# Patient Record
Sex: Female | Born: 1980 | Race: Black or African American | Hispanic: No | Marital: Single | Smoking: Current every day smoker
Health system: Southern US, Community
[De-identification: ages and names within clinical notes are randomized; demographics above are authoritative.]

## PROBLEM LIST (undated history)

## (undated) HISTORY — PX: TUBAL LIGATION: SHX77

## (undated) HISTORY — PX: CERVICAL CERCLAGE: SHX1329

---

## 1998-05-07 ENCOUNTER — Emergency Department (HOSPITAL_COMMUNITY): Admission: EM | Admit: 1998-05-07 | Discharge: 1998-05-07 | Payer: Self-pay | Admitting: Emergency Medicine

## 1999-11-01 ENCOUNTER — Emergency Department (HOSPITAL_COMMUNITY): Admission: EM | Admit: 1999-11-01 | Discharge: 1999-11-01 | Payer: Self-pay | Admitting: Emergency Medicine

## 2000-04-28 ENCOUNTER — Other Ambulatory Visit: Admission: RE | Admit: 2000-04-28 | Discharge: 2000-04-28 | Payer: Self-pay | Admitting: Obstetrics

## 2001-10-26 ENCOUNTER — Encounter: Admission: RE | Admit: 2001-10-26 | Discharge: 2001-10-26 | Payer: Self-pay | Admitting: *Deleted

## 2001-10-26 ENCOUNTER — Other Ambulatory Visit: Admission: RE | Admit: 2001-10-26 | Discharge: 2001-10-26 | Payer: Self-pay | Admitting: *Deleted

## 2001-10-26 ENCOUNTER — Encounter (INDEPENDENT_AMBULATORY_CARE_PROVIDER_SITE_OTHER): Payer: Self-pay | Admitting: *Deleted

## 2001-11-16 ENCOUNTER — Encounter: Admission: RE | Admit: 2001-11-16 | Discharge: 2001-11-16 | Payer: Self-pay | Admitting: *Deleted

## 2002-02-14 ENCOUNTER — Encounter: Admission: RE | Admit: 2002-02-14 | Discharge: 2002-02-14 | Payer: Self-pay | Admitting: Obstetrics and Gynecology

## 2002-08-17 ENCOUNTER — Inpatient Hospital Stay (HOSPITAL_COMMUNITY): Admission: AD | Admit: 2002-08-17 | Discharge: 2002-08-17 | Payer: Self-pay | Admitting: *Deleted

## 2002-08-23 ENCOUNTER — Encounter: Payer: Self-pay | Admitting: *Deleted

## 2002-08-23 ENCOUNTER — Ambulatory Visit (HOSPITAL_COMMUNITY): Admission: RE | Admit: 2002-08-23 | Discharge: 2002-08-23 | Payer: Self-pay | Admitting: *Deleted

## 2002-11-30 ENCOUNTER — Inpatient Hospital Stay (HOSPITAL_COMMUNITY): Admission: AD | Admit: 2002-11-30 | Discharge: 2002-11-30 | Payer: Self-pay | Admitting: Obstetrics & Gynecology

## 2002-12-18 ENCOUNTER — Inpatient Hospital Stay (HOSPITAL_COMMUNITY): Admission: AD | Admit: 2002-12-18 | Discharge: 2002-12-21 | Payer: Self-pay | Admitting: Obstetrics and Gynecology

## 2003-04-10 ENCOUNTER — Emergency Department (HOSPITAL_COMMUNITY): Admission: EM | Admit: 2003-04-10 | Discharge: 2003-04-11 | Payer: Self-pay

## 2003-08-06 ENCOUNTER — Emergency Department (HOSPITAL_COMMUNITY): Admission: EM | Admit: 2003-08-06 | Discharge: 2003-08-06 | Payer: Self-pay | Admitting: Emergency Medicine

## 2004-07-22 ENCOUNTER — Emergency Department (HOSPITAL_COMMUNITY): Admission: EM | Admit: 2004-07-22 | Discharge: 2004-07-22 | Payer: Self-pay | Admitting: Emergency Medicine

## 2004-09-24 ENCOUNTER — Emergency Department (HOSPITAL_COMMUNITY): Admission: EM | Admit: 2004-09-24 | Discharge: 2004-09-24 | Payer: Self-pay | Admitting: *Deleted

## 2004-12-24 ENCOUNTER — Emergency Department (HOSPITAL_COMMUNITY): Admission: EM | Admit: 2004-12-24 | Discharge: 2004-12-24 | Payer: Self-pay | Admitting: Emergency Medicine

## 2005-01-21 ENCOUNTER — Ambulatory Visit: Payer: Self-pay | Admitting: Certified Nurse Midwife

## 2005-01-21 ENCOUNTER — Inpatient Hospital Stay (HOSPITAL_COMMUNITY): Admission: AD | Admit: 2005-01-21 | Discharge: 2005-01-25 | Payer: Self-pay | Admitting: *Deleted

## 2005-01-21 ENCOUNTER — Encounter: Payer: Self-pay | Admitting: *Deleted

## 2005-01-30 ENCOUNTER — Encounter (INDEPENDENT_AMBULATORY_CARE_PROVIDER_SITE_OTHER): Payer: Self-pay | Admitting: *Deleted

## 2005-01-30 ENCOUNTER — Ambulatory Visit: Payer: Self-pay | Admitting: Obstetrics & Gynecology

## 2005-01-30 ENCOUNTER — Inpatient Hospital Stay (HOSPITAL_COMMUNITY): Admission: AD | Admit: 2005-01-30 | Discharge: 2005-01-31 | Payer: Self-pay | Admitting: Obstetrics and Gynecology

## 2005-02-17 ENCOUNTER — Inpatient Hospital Stay (HOSPITAL_COMMUNITY): Admission: AD | Admit: 2005-02-17 | Discharge: 2005-02-17 | Payer: Self-pay | Admitting: Obstetrics and Gynecology

## 2006-01-26 ENCOUNTER — Emergency Department (HOSPITAL_COMMUNITY): Admission: EM | Admit: 2006-01-26 | Discharge: 2006-01-26 | Payer: Self-pay | Admitting: Emergency Medicine

## 2006-04-07 ENCOUNTER — Emergency Department (HOSPITAL_COMMUNITY): Admission: EM | Admit: 2006-04-07 | Discharge: 2006-04-07 | Payer: Self-pay | Admitting: Emergency Medicine

## 2006-06-17 ENCOUNTER — Emergency Department (HOSPITAL_COMMUNITY): Admission: EM | Admit: 2006-06-17 | Discharge: 2006-06-18 | Payer: Self-pay | Admitting: Emergency Medicine

## 2006-09-07 ENCOUNTER — Emergency Department (HOSPITAL_COMMUNITY): Admission: EM | Admit: 2006-09-07 | Discharge: 2006-09-07 | Payer: Self-pay | Admitting: Emergency Medicine

## 2006-11-15 ENCOUNTER — Emergency Department (HOSPITAL_COMMUNITY): Admission: EM | Admit: 2006-11-15 | Discharge: 2006-11-15 | Payer: Self-pay | Admitting: Emergency Medicine

## 2007-08-08 ENCOUNTER — Emergency Department (HOSPITAL_COMMUNITY): Admission: EM | Admit: 2007-08-08 | Discharge: 2007-08-08 | Payer: Self-pay | Admitting: Emergency Medicine

## 2007-08-27 ENCOUNTER — Other Ambulatory Visit: Admission: RE | Admit: 2007-08-27 | Discharge: 2007-08-27 | Payer: Self-pay | Admitting: Obstetrics and Gynecology

## 2007-09-13 ENCOUNTER — Inpatient Hospital Stay (HOSPITAL_COMMUNITY): Admission: AD | Admit: 2007-09-13 | Discharge: 2007-09-13 | Payer: Self-pay | Admitting: Obstetrics and Gynecology

## 2007-10-09 ENCOUNTER — Ambulatory Visit (HOSPITAL_COMMUNITY): Admission: RE | Admit: 2007-10-09 | Discharge: 2007-10-10 | Payer: Self-pay | Admitting: General Surgery

## 2008-01-23 ENCOUNTER — Inpatient Hospital Stay (HOSPITAL_COMMUNITY): Admission: AD | Admit: 2008-01-23 | Discharge: 2008-01-23 | Payer: Self-pay | Admitting: Obstetrics and Gynecology

## 2008-01-24 ENCOUNTER — Inpatient Hospital Stay (HOSPITAL_COMMUNITY): Admission: AD | Admit: 2008-01-24 | Discharge: 2008-01-24 | Payer: Self-pay | Admitting: Obstetrics and Gynecology

## 2008-02-04 ENCOUNTER — Inpatient Hospital Stay (HOSPITAL_COMMUNITY): Admission: AD | Admit: 2008-02-04 | Discharge: 2008-02-04 | Payer: Self-pay | Admitting: Obstetrics and Gynecology

## 2008-02-06 ENCOUNTER — Encounter (INDEPENDENT_AMBULATORY_CARE_PROVIDER_SITE_OTHER): Payer: Self-pay | Admitting: Obstetrics and Gynecology

## 2008-02-06 ENCOUNTER — Inpatient Hospital Stay (HOSPITAL_COMMUNITY): Admission: AD | Admit: 2008-02-06 | Discharge: 2008-02-08 | Payer: Self-pay | Admitting: Obstetrics and Gynecology

## 2008-04-20 ENCOUNTER — Emergency Department (HOSPITAL_COMMUNITY): Admission: EM | Admit: 2008-04-20 | Discharge: 2008-04-21 | Payer: Self-pay | Admitting: Emergency Medicine

## 2008-05-20 ENCOUNTER — Ambulatory Visit (HOSPITAL_COMMUNITY): Admission: RE | Admit: 2008-05-20 | Discharge: 2008-05-20 | Payer: Self-pay | Admitting: Obstetrics and Gynecology

## 2009-06-09 ENCOUNTER — Emergency Department (HOSPITAL_COMMUNITY): Admission: EM | Admit: 2009-06-09 | Discharge: 2009-06-09 | Payer: Self-pay | Admitting: Emergency Medicine

## 2009-07-16 ENCOUNTER — Emergency Department (HOSPITAL_COMMUNITY): Admission: EM | Admit: 2009-07-16 | Discharge: 2009-07-16 | Payer: Self-pay | Admitting: Emergency Medicine

## 2010-07-27 LAB — URINALYSIS, ROUTINE W REFLEX MICROSCOPIC
Bilirubin Urine: NEGATIVE
Glucose, UA: NEGATIVE mg/dL
Ketones, ur: NEGATIVE mg/dL
Specific Gravity, Urine: 1.01 (ref 1.005–1.030)

## 2010-07-27 LAB — CBC
HCT: 41.7 % (ref 36.0–46.0)
Hemoglobin: 13.8 g/dL (ref 12.0–15.0)
MCHC: 33 g/dL (ref 30.0–36.0)
RDW: 14.8 % (ref 11.5–15.5)

## 2010-07-27 LAB — PREGNANCY, URINE: Preg Test, Ur: NEGATIVE

## 2010-08-24 NOTE — H&P (Signed)
NAME:  Nicole [name], Nicole [name] NO.:  [id number]   MEDICAL RECORD NO.:  [id number]           PATIENT TYPE:   LOCATION:                                 FACILITY:   PHYSICIAN:  Gerald Leitz, MD          DATE OF BIRTH:  01-Aug-1980   DATE OF ADMISSION:  DATE OF DISCHARGE:                              HISTORY & PHYSICAL   The patient scheduled for surgery on October 02, 2007.   HISTORY OF PRESENT ILLNESS:  This is a 30 year old G5, P1-0-3-1 at 15  weeks and 3 days intrauterine pregnancy based on a 6-week ultrasound  with estimated date of delivery March 22, 2008.  Pregnancy is  complicated by history of LEEP procedure as well as history of cervical  incompetence with previous pregnancy on ultrasound performed September 25, 2007.  The cervix measured 4.62 cm.  The patient denies any contraction.  She has had some vaginal bleeding, had an ultrasound that did not show  any abnormalities, but prep in the office did show a candidiasis and she  has been treated with Terazol cream 0.8%.   Past medical history is negative.   PAST SURGICAL HISTORY:  Cerclage in 2006 and LEEP procedure in 1997.   MEDICATIONS:  Prenatal vitamins and Terazol cream.   ALLERGIES:  No known drug allergies.   PAST GYN HISTORY:  Gonorrhea at age 64, Trichomonas at 2006, Pap smear  performed Aug 27, 2007 was normal, and history of LEEP procedure in 1997  and 1998.   SOCIAL HISTORY:  Single.  The patient smokes tobacco.  Denies alcohol or  illicit drug use.   REVIEW OF SYSTEMS:  Negative except as stated in history of current  illness.   PHYSICAL EXAMINATION:  VITAL SIGNS:  Blood pressure 114/68 and weight  213 pounds.  CARDIOVASCULAR:  Regular rate and rhythm.  LUNGS:  Clear to auscultation bilaterally.  ABDOMEN:  Soft, nontender, and nondistended.  No masses.  PELVIC:  Old blood in the vaginal vault.  Wet prep was obtained and  shows candidiasis.  The cervix is closed.  Fundal height is  approximately  14 cm.  No evidence of masses or tenderness.   IMPRESSION AND PLAN:  A 15-week and 3-day intrauterine pregnancy with  history of cervical incompetence.  Desires management with cerclage.  Risks, benefits, and alternatives of the clots were discussed with the  patient including but not limited to infection, bleeding, possible  preterm labor and delivery of a nonviable infant.  The patient voiced  understanding and was asked to proceed with cervical cerclage.      Gerald Leitz, MD  Electronically Signed     TC/MEDQ  D:  09/28/2007  T:  09/29/2007  Job:  [id number]

## 2010-08-24 NOTE — H&P (Signed)
NAME:  Nicole [name], Nicole [name]               ACCOUNT NO.:  [id number]   MEDICAL RECORD NO.:  [id number]          PATIENT TYPE:  AMB   LOCATION:  DAY                           [facility]  [facility]   PHYSICIAN:  Gerald Leitz, MD          DATE OF BIRTH:  09/05/1980   DATE OF ADMISSION:  05/20/2008  DATE OF DISCHARGE:                              HISTORY & PHYSICAL   HISTORY OF PRESENT ILLNESS:  This is a 30 year old G5, P 1-1-3-2, who  desires permanent sterilization.   PAST OBSTETRICAL HISTORY:  Spontaneous vaginal delivery x2, elective  abortion x2, still birth at 19.5 weeks.   PAST MEDICAL HISTORY:  Negative.   PAST SURGICAL HISTORY:  Cervical cerclage in 2006 and in 2009.   PAST GYNECOLOGIC HISTORY:  History of gonorrhea at the age of 30 and  Trichomonas in 2006.   MEDICATIONS:  None.   ALLERGIES:  No known drug allergies.   SOCIAL HISTORY:  The patient is single.  She smokes cigarettes  approximately 5 cigarettes a day.  Reports alcohol usage on weekends.  Denies any illicit drug use.   FAMILY HISTORY:  Noncontributory.   PHYSICAL EXAMINATION:  CARDIOVASCULAR:  Regular rate and rhythm.  LUNGS:  Clear to auscultation bilaterally.  ABDOMEN:  Soft, nontender, and nondistended.  No masses.  Positive bowel  sounds.  EXTREMITIES:  No clubbing, cyanosis, or edema.  PELVIC:  Normal external female genitalia.  No vulvar, vaginal, or  cervical lesions noted.  Bimanual exam reveals normal size uterus, no  adnexal masses or tenderness.   IMPRESSION AND PLAN:  Desires permanent sterilization via laparoscopic  tubal ligation.  Risks, benefits, and alternatives of the tubal ligation  were discussed with the patient including, but not limited to infection,  bleeding, damage to bowel or bladder, and surrounding organs with the  need for further surgery, 1% risk of failure with 50% risk of ectopic  pregnancy which may be life-threatening if failure occurs, also  discussed with the patient the risks of  transfusion, human  immunodeficiency virus, hepatitis B, C, and syphilis.  She voiced  understanding and desires to proceed with laparoscopic bilateral tubal  ligation.     Gerald Leitz, MD  Electronically Signed    TC/MEDQ  D:  05/16/2008  T:  05/17/2008  Job:  [id number]

## 2010-08-24 NOTE — Op Note (Signed)
NAMEEUVA, RUNDELL               ACCOUNT NO.:  [id number]   MEDICAL RECORD NO.:  [id number]          PATIENT TYPE:  OBV   LOCATION:  9318                          [facility]ACILITY:  [facility]   PHYSICIAN:  Gerald Leitz, MD          DATE OF BIRTH:  1980-11-06   DATE OF PROCEDURE:  DATE OF DISCHARGE:                               OPERATIVE REPORT   PREOPERATIVE DIAGNOSES:  1. 16-week 3-day intrauterine pregnancy.  2. History of cervical incompetence.   POSTOPERATIVE DIAGNOSES:  1. 16-week 3-day intrauterine pregnancy.  2. History of cervical incompetence.   PROCEDURE:  McDonald cervical cerclage.   SURGEON:  Gerald Leitz, MD   ASSISTANT:  None.   ANESTHESIA:  Spinal.   FINDINGS:  Cervix was closed and soft.   SPECIMEN:  None.   ESTIMATED BLOOD LOSS:  Minimal.   COMPLICATIONS:  None.   INDICATION:  A 30 year old G 5, P 1-0-3-1 with history of cervical  incompetence with a previous pregnancy who desired therapy with cervical  cerclage.  The patient had a LEEP procedure in 1997.   PROCEDURE:  Informed consent was obtained.  The patient was taken to the  operating room where spinal anesthesia was placed.  She was placed in  the dorsal lithotomy position, prepped and draped in the usual sterile  fashion.  Weighted speculum placed in the vaginal vault.  The cervix was  visualized and grasped posteriorly with a ring forceps.  Due to the  short nature of the posterior cervix, a 1 Prolene was placed  circumferentially around the  cervix using McDonald technique.  It was tied at the 12 o'clock  position.  Excellent hemostasis was noted.  All instruments were removed  from the vagina.  Indocin was placed into the rectum, 50 mg.  The  patient was taken to the recovery room awake and in stable condition.      Gerald Leitz, MD  Electronically Signed     TC/MEDQ  D:  10/09/2007  T:  10/10/2007  Job:  [id number]

## 2010-08-24 NOTE — Op Note (Signed)
NAME:  BASHA, KRYGIER NO.:  [id number]   MEDICAL RECORD NO.:  [id number]          PATIENT TYPE:  AMB   LOCATION:  SDC                           [facility]ILITY:  [facility]   PHYSICIAN:  Gerald Leitz, MD          DATE OF BIRTH:  [birth year]   DATE OF PROCEDURE:  05/20/2008  DATE OF DISCHARGE:                               OPERATIVE REPORT   PREOPERATIVE DIAGNOSIS:  Desires permanent sterilization.   POSTOPERATIVE DIAGNOSIS:  Desires permanent sterilization.   PROCEDURE:  Laparoscopic bilateral tubal ligation with Filshie clips.   SURGEON:  Dr. Gerald Leitz, MD   ASSISTANT:  None.   ANESTHESIA:  General.   FINDINGS:  Normal fallopian tubes and ovaries.   SPECIMEN:  None.   ESTIMATED BLOOD LOSS:  Minimal.   FLUIDS:  1500 mL of LR.   URINE OUTPUT:  Approximately 200 mL of clear urine at the beginning of  procedure.   PROCEDURE:  The patient was taken to the operating room where she was  placed under general anesthesia.  She was then placed in the dorsal  lithotomy position and prepped and draped in the usual sterile fashion.  A uterine manipulator was placed into the cervix.  Attention was turned  to the abdomen where a 10-mm skin incision was made with a scalpel and a  10-mm trocar was placed under direct visualization.  Pneumoperitoneum  was achieved with CO2 gas.  Opening pressure was 7.  Operative scope was  used to place Filshie clip along the right fallopian tube.  Proper  placement was assured.  The Filshie clip applier was removed, reloaded,  and Filshie clips were then placed along the left fallopian tube.  Proper placement was assured.  Fimbria were identified bilaterally.  Prior to placement and after placement, pneumoperitoneum was released  and the 10 L trocar was removed under direct visualization.  The fascia  was reapproximated with 0 Vicryl.  Skin was closed with 4-0 Vicryl.  Sponge, lap, needle counts were correct x2.  Quater percent Marcaine was  injected along the incision site for postoperative anesthesia.  The uterine manipulator was removed.  The patient was noted to have some  bleeding from the uterine manipulator site.  Silver nitrate was applied.  Excellent hemostasis noted.  The patient was awakened from anesthesia  and taken to recovery room in excellent condition.      Gerald Leitz, MD  Electronically Signed     TC/MEDQ  D:  05/20/2008  T:  05/21/2008  Job:  [id number]

## 2010-08-27 NOTE — Discharge Summary (Signed)
NAME:  Nicole [name], Nicole [name]               ACCOUNT NO.:  [id number]   MEDICAL RECORD NO.:  [id number]           PATIENT TYPE:   LOCATION:                                 FACILITY:   PHYSICIAN:  Phil D. Okey Dupre, M.D.          DATE OF BIRTH:   DATE OF ADMISSION:  01/30/2005  DATE OF DISCHARGE:  01/31/2005                                 DISCHARGE SUMMARY   ADMITTING DIAGNOSES:  1.  Preterm premature rupture of membranes.  2.  Inevitable abortion.  3.  Failed cerclage.   DISCHARGE DIAGNOSES:  Status post spontaneous vaginal delivery.   ADMITTING HISTORY AND PHYSICAL:  Patient is a 30 year old G5, P0-1-3-1 who  presented at 19-5/7 weeks by her LMP stating that she had a fluid gush.  Past medical history is significant for having a cerclage placed five days  previously.  She also had history of Trichomonas which was treated in the  past week.   MEDICATIONS:  Prenatal vitamins.   ALLERGIES:  None.   OBSTETRICAL HISTORY:  Spontaneous vaginal delivery at 35 weeks, two TABs,  and one SAB.   GYN HISTORY:  History of LEEP, colpo x2.   MEDICAL HISTORY:  Asthma.   SURGICAL HISTORY:  Cerclage on October 16 of 2006.   FAMILY HISTORY:  No smoking.   PHYSICAL EXAMINATION:  VITAL SIGNS:  She was afebrile.  Vital signs stable.  GENERAL:  No acute distress.  GENITOURINARY:  Her os was visually opened by the speculum examination with  pooling.  Nitrazine and fern positive.   HOSPITAL COURSE:  Patient was admitted.  Her cerclage was removed.  Dr.  Shawnie Pons had discussion with the patient and patient wished to have Cytotec  placed.  She also was started on Unasyn.  Patient remained stable.  She did  deliver the fetus on January 30, 2005.  There was no signs of life at birth.  Patient received a dose of Cytotec per rectum and the placenta was delivered  at 1415.  The baby was delivered at 10:40.  Patient did have temperature  elevation up to 104.6 but she defervesced after the delivery of the  placenta.  Her Unasyn also was stopped after delivery of the placenta.  Patient did have a pastoral care see her as well as social work and  discussed postpartum depression and follow-up care concerning that and  patient was emotionally doing well throughout her hospital course.   DISCHARGE DISCUSSION:  The patient was discharged to home.  She is to follow  up [facility] with [facility].   DISCHARGE MEDICATIONS:  Ibuprofen 600 mg one p.o. q.4h.  She is to continue  her prenatal vitamins.  She was given a shot of Depo Provera before she  left.   Patient and I had a conversation about postpartum depression and symptoms of  this and increased risk of this with intrauterine fetal death and she  expressed understanding and stated that she would report to the clinic or  MAU if she were having any problems.  She also stated that she  had a contact  number for the social worker in case she needed to speak with someone  concerning her loss.     ______________________________  August Saucer Merlene Morse, MD    ______________________________  Javier Glazier Okey Dupre, M.D.    ABC/MEDQ  D:  01/31/2005  T:  01/31/2005  Job:  [id number]

## 2010-08-27 NOTE — Discharge Summary (Signed)
NAME:  Nicole [name], Nicole [name] NO.:  [id number]   MEDICAL RECORD NO.:  [id number]          PATIENT TYPE:  WOC   LOCATION:  WOC                          FACILITY:  [facility]   PHYSICIAN:  Lesly Dukes, M.D. DATE OF BIRTH:  12-24-1980   DATE OF ADMISSION:  DATE OF DISCHARGE:                                 DISCHARGE SUMMARY   CONSULTATIONS:  None.   DIAGNOSES:  1.  Cervical incompetence.  2.  Status post cerclage with tag at 1 o'clock position.  3.  Trichomonas.  4.  Yeast.  5.  Urinary tract infection.  6.  Threatened abortion.   PROCEDURES:  1.  Ultrasound January 21, 2005, showing an alive, normal, breach baby at 28      and 2 with a 1.1 and 1.3 cm cervix.  2.  Technical procedure with cerclage on January 24, 2005, by Dr. Penne Lash.   MEDICATIONS:  Prenatal vitamins.   HOSPITAL COURSE:  A 30 year old G5, P0-1-3-1, came in 19-3/4 weeks with  history of 3 spontaneous abortions and 1 preterm at 35 weeks.  She came in  with abdominal pain and ultrasound from [facility]  indicating short cervix.  The patient was monitored with no contractions and  reassuring fetal heart tones.  The patient was placed in Trendelenburg.  Lab  studies indicated the patient had trichomonas, yeast, and possible urinary  tract infection.  She was treated with Flagyl, Diflucan, ampicillin  expectantly.  On January 24, 2005, the patient had cerclage with tag placed  at 1 o'clock position. The patient was on indomethacin at admission and did  not have any contractions throughout her hospital stay.  She was discharged  on postop day #1 after cervix recheck showed that cervix was stable,  fingertip.   IMPORTANT LABORATORY STUDIES:  HIV nonreactive.  GC and chlamydia negative.  Moderate yeast.  Positive Trichomonas on urinalysis.  Possible evidence of  UTI on urinalysis.  Hemoglobin 11.7, platelets 179, white count 10.8.   No contractions.  Fetal heart tracing  reassuring.   DISCHARGE INSTRUCTIONS:  The patient will be discharged with bed rest, no  sex.  The patient will follow up in OB high risk clinic on February 02, 2005,  at 8 o'clock in the morning.      Angeline Slim, M.D.    ______________________________  Lesly Dukes, M.D.    AL/MEDQ  D:  01/25/2005  T:  01/25/2005  Job:  [id number]   cc:   [facility], [facility]

## 2010-08-27 NOTE — Op Note (Signed)
NAMESHARRELL, Nicole [name]               ACCOUNT NO.:  [id number]   MEDICAL RECORD NO.:  [id number]          PATIENT TYPE:  INP   LOCATION:  9304                          [facility]ACILITY:  [facility]   PHYSICIAN:  Lesly Dukes, M.D. DATE OF BIRTH:  10-04-1980   DATE OF PROCEDURE:  01/24/2005  DATE OF DISCHARGE:                                 OPERATIVE REPORT   PREOPERATIVE DIAGNOSIS:  A 30 year old Para 0-1-3-1 at 19 weeks 2 days with  pancervical exam __________.   POSTOPERATIVE DIAGNOSIS:  A 30 year old Para 0-1-3-1 at 19 weeks 2 days with  pancervical exam __________ .   PROCEDURE:  Rescue McDonald cerclage.   SURGEON:  Lesly Dukes, M.D.   ANESTHESIA:  Spinal.   ESTIMATED BLOOD LOSS:  Minimal.   FINDINGS:  Before procedure cervix with 1-2 cm, soft, short with positive  fetal parts felt.  After the cerclage she is barely fingertip, short, with  no fetal parts felt.   DESCRIPTION OF PROCEDURE:  After informed consent was obtained the patient  was taken to the operating room where spinal anesthesia was found to be  adequate.  The patient was in Trendelenburg.  The patient was prepped and  draped in the normal sterile fashion with Hibiclens.  A Foley was in the  bladder. Retractors were placed in the vagina and the cervix was brought  into view with sponge stick.  A #1 Prolene was used to tie the sutures in a  pursestring fashion as a McDonald cerclage. A knot was placed at  approximately 1 o'clock with a tag.  This means that there is a set of knots  close the cervix, an empty loop and then a set of knots after that.  The tab  will help aid in visualization upon removal at 36 weeks.  Findings are as  above.  Instruments are removed that were placed in the vagina. There was  good hemostasis at the end of the procedure. The patient went to the  recovery room in stable condition.  Sponge, lap, instrument and needle  counts were correct x2.           ______________________________  Lesly Dukes, M.D.     KHL/MEDQ  D:  01/24/2005  T:  01/24/2005  Job:  [id number]

## 2010-10-18 ENCOUNTER — Inpatient Hospital Stay (HOSPITAL_COMMUNITY)
Admission: RE | Admit: 2010-10-18 | Discharge: 2010-10-18 | Disposition: A | Discharge status: Home / Self Care | Payer: Self-pay | Source: Ambulatory Visit | Attending: Emergency Medicine | Admitting: Emergency Medicine

## 2011-01-06 LAB — URINALYSIS, ROUTINE W REFLEX MICROSCOPIC
Bilirubin Urine: NEGATIVE
Glucose, UA: NEGATIVE
Glucose, UA: NEGATIVE
Hgb urine dipstick: NEGATIVE
Ketones, ur: 15 — AB
Leukocytes, UA: NEGATIVE
Nitrite: NEGATIVE
Protein, ur: NEGATIVE
Protein, ur: NEGATIVE
Urobilinogen, UA: 0.2
Urobilinogen, UA: 0.2

## 2011-01-06 LAB — CBC
HCT: 39.1
Hemoglobin: 12.1
Hemoglobin: 12.7
MCHC: 33.5
MCHC: 32.4
MCHC: 34.3
MCV: 86.8
MCV: 86.8
Platelets: 208
RBC: 3.99
RBC: 4.51
RDW: 15.1
RDW: 14
RDW: 14.7

## 2011-01-06 LAB — DIFFERENTIAL
Basophils Absolute: 0.1
Basophils Relative: 1
Eosinophils Absolute: 0.2
Monocytes Absolute: 0.3
Neutro Abs: 5.7

## 2011-01-06 LAB — URINE MICROSCOPIC-ADD ON

## 2011-01-10 LAB — CBC
HCT: 28.3 — ABNORMAL LOW
HCT: 33.4 — ABNORMAL LOW
Hemoglobin: 9.6 — ABNORMAL LOW
MCV: 87.3
Platelets: 174
RBC: 3.21 — ABNORMAL LOW
RBC: 3.83 — ABNORMAL LOW
RDW: 14.1
WBC: 16.3 — ABNORMAL HIGH

## 2011-01-10 LAB — RPR: RPR Ser Ql: NONREACTIVE

## 2011-01-10 LAB — WET PREP, GENITAL: Trich, Wet Prep: NONE SEEN

## 2011-04-30 ENCOUNTER — Emergency Department (HOSPITAL_COMMUNITY)
Admission: EM | Admit: 2011-04-30 | Discharge: 2011-04-30 | Disposition: A | Discharge status: Home / Self Care | Payer: Self-pay | Attending: Emergency Medicine | Admitting: Emergency Medicine

## 2011-04-30 ENCOUNTER — Encounter (HOSPITAL_COMMUNITY): Payer: Self-pay | Admitting: Emergency Medicine

## 2011-04-30 DIAGNOSIS — K089 Disorder of teeth and supporting structures, unspecified: Secondary | ICD-10-CM | POA: Insufficient documentation

## 2011-04-30 DIAGNOSIS — X58XXXA Exposure to other specified factors, initial encounter: Secondary | ICD-10-CM | POA: Insufficient documentation

## 2011-04-30 DIAGNOSIS — S025XXA Fracture of tooth (traumatic), initial encounter for closed fracture: Secondary | ICD-10-CM

## 2011-04-30 DIAGNOSIS — K0889 Other specified disorders of teeth and supporting structures: Secondary | ICD-10-CM

## 2011-04-30 MED ORDER — PENICILLIN V POTASSIUM 500 MG PO TABS: 500.0000 mg | ORAL_TABLET | Freq: Three times a day (TID) | ORAL | Status: AC

## 2011-04-30 MED ORDER — HYDROCODONE-ACETAMINOPHEN 5-325 MG PO TABS
1.0000 | ORAL_TABLET | Freq: Once | ORAL | Status: AC
  Administered 2011-04-30: 1 via ORAL
  Filled 2011-04-30: qty 1

## 2011-04-30 MED ORDER — HYDROCODONE-ACETAMINOPHEN 5-325 MG PO TABS: 1.0000 | ORAL_TABLET | Freq: Four times a day (QID) | ORAL | Status: AC | PRN

## 2011-04-30 NOTE — ED Notes (Signed)
C/o right lower molar toothache for the past 3 weeks--describes as a throbbing constant ache---rates the pain 10 on 1-10 scale.

## 2011-04-30 NOTE — ED Provider Notes (Signed)
History     CSN: [id number]  Arrival date & time 04/30/11  0118   First MD Initiated Contact with Patient 04/30/11 0131      Chief Complaint  Patient presents with  . Dental Pain    (Consider location/radiation/quality/duration/timing/severity/associated sxs/prior treatment) HPI This is a 31 year old black female with about a two-month history of a fracture of the right lower second molar. It began hurting over the past several days, with the pain becoming severe yesterday evening about 10:30. She has got inadequate relief with naproxen sodium and Orajel. She also took some of her mother's penicillin yesterday. She states the pain is worse with chewing and drinking cold beverages. She does not have a dentist.  History reviewed. No pertinent past medical history.  Past Surgical History  Procedure Date  . Cervical cerclage   . Tubal ligation     No family history on file.  History  Substance Use Topics  . Smoking status: Current Everyday Smoker  . Smokeless tobacco: Not on file  . Alcohol Use: Yes     2-3 times per week, 2 drinks per time    OB History    Grav Para Term Preterm Abortions TAB SAB Ect Mult Living                  Review of Systems  All other systems reviewed and are negative.    Allergies  Review of patient's allergies indicates no known allergies.  Home Medications   Current Outpatient Rx  Name Route Sig Dispense Refill  . BENZOCAINE 10 % MT GEL Mouth/Throat Use as directed in the mouth or throat 3 (three) times daily as needed.    Marland Kitchen NAPROXEN SODIUM 220 MG PO TABS Oral Take 220 mg by mouth 2 (two) times daily with a meal.      BP 134/77  Pulse 72  Temp(Src) 98.6 F (37 C) (Oral)  Resp 18  SpO2 100%  LMP 04/05/2011  Physical Exam General: Well-developed, well-nourished female in no acute distress; appearance consistent with age of record HENT: normocephalic, atraumatic; Ellis 3 fracture of right lower second molar, tender to  percussion Eyes: pupils equal round and reactive to light; extraocular muscles intact Neck: supple; no lymphadenopathy Heart: regular rate and rhythm Lungs: No respiratory effort and excursion Abdomen: soft; nondistended Extremities: No deformity; full range of motion Neurologic: Awake, alert and oriented; motor function intact in all extremities and symmetric; no facial droop Skin: Warm and dry Psychiatric: Normal mood and affect    ED Course  Procedures (including critical care time)     MDM          Hanley Seamen, MD 04/30/11 [phone]

## 2011-04-30 NOTE — ED Notes (Addendum)
Per Pt: severe tooth pain for two weeks in last tooth on lower right jaw, pain radiates to ear. Taking Aleve and intermittently using Orajel without relief. Took 4 old Penicilin pills also (education provided.) Pt has not contacted a dentist. Called 911 due to pain and came to ED in ambulance. Is A&O, ambulatory WNL.

## 2011-11-09 ENCOUNTER — Emergency Department (HOSPITAL_COMMUNITY)
Admission: EM | Admit: 2011-11-09 | Discharge: 2011-11-09 | Discharge status: Against Medical Advice | Payer: Self-pay | Attending: Emergency Medicine | Admitting: Emergency Medicine

## 2011-11-09 ENCOUNTER — Encounter (HOSPITAL_COMMUNITY): Payer: Self-pay | Admitting: *Deleted

## 2011-11-09 DIAGNOSIS — K6289 Other specified diseases of anus and rectum: Secondary | ICD-10-CM | POA: Insufficient documentation

## 2011-11-09 NOTE — ED Notes (Signed)
To ED for eval of possible abscess to rectal area. States she noticed the pain 4 days ago. Has used warm compresses. Pt appears in pain

## 2011-11-09 NOTE — ED Notes (Signed)
Pt showed me a picture of buttocks, pt has a hemorrhoid

## 2012-08-09 ENCOUNTER — Encounter (HOSPITAL_COMMUNITY): Payer: Self-pay | Admitting: *Deleted

## 2012-08-09 ENCOUNTER — Emergency Department (HOSPITAL_COMMUNITY)
Admission: EM | Admit: 2012-08-09 | Discharge: 2012-08-10 | Disposition: A | Discharge status: Home / Self Care | Payer: Self-pay | Attending: Emergency Medicine | Admitting: Emergency Medicine

## 2012-08-09 DIAGNOSIS — R6883 Chills (without fever): Secondary | ICD-10-CM | POA: Insufficient documentation

## 2012-08-09 DIAGNOSIS — R51 Headache: Secondary | ICD-10-CM | POA: Insufficient documentation

## 2012-08-09 DIAGNOSIS — F17200 Nicotine dependence, unspecified, uncomplicated: Secondary | ICD-10-CM | POA: Insufficient documentation

## 2012-08-09 DIAGNOSIS — K029 Dental caries, unspecified: Secondary | ICD-10-CM | POA: Insufficient documentation

## 2012-08-09 DIAGNOSIS — M542 Cervicalgia: Secondary | ICD-10-CM | POA: Insufficient documentation

## 2012-08-09 DIAGNOSIS — H9209 Otalgia, unspecified ear: Secondary | ICD-10-CM | POA: Insufficient documentation

## 2012-08-09 DIAGNOSIS — K089 Disorder of teeth and supporting structures, unspecified: Secondary | ICD-10-CM | POA: Insufficient documentation

## 2012-08-09 DIAGNOSIS — K0889 Other specified disorders of teeth and supporting structures: Secondary | ICD-10-CM

## 2012-08-09 NOTE — ED Provider Notes (Signed)
History  This chart was scribed for non-physician practitioner Dierdre Forth, PA-C working with Glynn Octave, MD, by Candelaria Stagers, ED Scribe. This patient was seen in room WTR7/WTR7 and the patient's care was started at 11:42 PM   CSN: [id number]  Arrival date & time 08/09/12  2250   First MD Initiated Contact with Patient 08/09/12 2332      Chief Complaint  Patient presents with  . Dental Pain     The history is provided by the patient. No language interpreter was used.   Nicole [name] is a 32 y.o. female who presents to the Emergency Department complaining of constant right lower dental pain that started about one year ago and became worse five days ago gradually worsening.  Pt reports she has a hole in the back lower right molar.  She is also experiencing right ear pain, right sided facial pain, right sided neck pain, and chills.  Pt has applied Orajel with no relief.  She has used Ring Relief drops with no relief of ear pain.  She has also taken ibuprofen with no relief. Eating makes the pain worse and ibuprofen makes it some better but does not completely take away the pain.    History reviewed. No pertinent past medical history.  Past Surgical History  Procedure Laterality Date  . Cervical cerclage    . Tubal ligation      No family history on file.  History  Substance Use Topics  . Smoking status: Current Every Day Smoker  . Smokeless tobacco: Not on file  . Alcohol Use: Yes     Comment: 2-3 times per week, 2 drinks per time    OB History   Grav Para Term Preterm Abortions TAB SAB Ect Mult Living                  Review of Systems  HENT: Positive for ear pain (right ear pain), neck pain (right sided neck pain) and dental problem (right lower dental pain).   All other systems reviewed and are negative.    Allergies  Review of patient's allergies indicates no known allergies.  Home Medications   Current Outpatient Rx  Name  Route  Sig  Dispense   Refill  . benzocaine (ORAJEL) 10 % mucosal gel   Mouth/Throat   Use as directed 1 application in the mouth or throat 3 (three) times daily as needed for pain.          Marland Kitchen ibuprofen (ADVIL,MOTRIN) 200 MG tablet   Oral   Take 200 mg by mouth every 6 (six) hours as needed for pain.         . naproxen sodium (ANAPROX) 220 MG tablet   Oral   Take 220 mg by mouth 2 (two) times daily as needed (for pain).          Marland Kitchen OVER THE COUNTER MEDICATION   Oral   Take 1 application by mouth 2 (two) times daily as needed (for dental pain). "Bleach applied to tooth with q-tip"         . oxyCODONE-acetaminophen (PERCOCET) 5-325 MG per tablet   Oral   Take 1 tablet by mouth every 4 (four) hours as needed for pain.   20 tablet   0   . penicillin v potassium (VEETID) 500 MG tablet   Oral   Take 1 tablet (500 mg total) by mouth 4 (four) times daily.   40 tablet   0     BP  155/107  Pulse 106  Temp(Src) 98.3 F (36.8 C) (Oral)  Resp 20  SpO2 99%  LMP 08/06/2012  Physical Exam  Nursing note and vitals reviewed. Constitutional: She appears well-developed and well-nourished.  HENT:  Head: Normocephalic.  Right Ear: Tympanic membrane, external ear and ear canal normal. Tympanic membrane is not erythematous and not bulging.  Left Ear: Tympanic membrane, external ear and ear canal normal. Tympanic membrane is not erythematous and not bulging.  Nose: Nose normal. Right sinus exhibits no maxillary sinus tenderness and no frontal sinus tenderness. Left sinus exhibits no maxillary sinus tenderness and no frontal sinus tenderness.  Mouth/Throat: Uvula is midline, oropharynx is clear and moist and mucous membranes are normal. No oral lesions. Abnormal dentition. Dental caries present. No edematous or lacerations. No oropharyngeal exudate, posterior oropharyngeal edema, posterior oropharyngeal erythema or tonsillar abscesses.  Caries in the right rear molar.  Mild erythema around base of the tooth.   No induration, no fluctuants.    Eyes: Conjunctivae are normal. Pupils are equal, round, and reactive to light. Right eye exhibits no discharge. Left eye exhibits no discharge.  Neck: Normal range of motion. Neck supple.  Cardiovascular: Regular rhythm and normal heart sounds.   Mildly tachycardic  Pulmonary/Chest: Effort normal and breath sounds normal. No respiratory distress. She has no wheezes.  Abdominal: Soft. Bowel sounds are normal. She exhibits no distension. There is no tenderness.  Lymphadenopathy:    She has no cervical adenopathy.  Neurological: She is alert.  Skin: Skin is warm and dry.  Psychiatric: She has a normal mood and affect. Her behavior is normal.    ED Course  Dental Date/Time: 08/10/2012 12:08 AM Performed by: Dierdre Forth Authorized by: Dierdre Forth Consent: Verbal consent obtained. Risks and benefits: risks, benefits and alternatives were discussed Consent given by: patient Patient understanding: patient states understanding of the procedure being performed Patient consent: the patient's understanding of the procedure matches consent given Procedure consent: procedure consent matches procedure scheduled Relevant documents: relevant documents present and verified Site marked: the operative site was marked Required items: required blood products, implants, devices, and special equipment available Patient identity confirmed: verbally with patient and arm band Preparation: Patient was prepped and draped in the usual sterile fashion. Local anesthesia used: yes Anesthesia: local infiltration Local anesthetic: bupivacaine 0.5% with epinephrine Patient sedated: no Patient tolerance: Patient tolerated the procedure well with no immediate complications. Comments: Dental block of tooth #31 with complete relief of pain and without complication     DIAGNOSTIC STUDIES: Oxygen Saturation is 99% on room air, normal by my interpretation.     COORDINATION OF CARE:  11:55 PM Discussed course of care with pt which includes dental block.  Advised pt to follow up with a dentist.  Pt understands and agrees.   11:56 PM Dental block of right lower back molar performed by Dierdre Forth, PA-C.  Pt tolerated procedure with no problems.    Labs Reviewed - No data to display No results found.   1. Pain, dental   2. Dental caries       MDM  Lezlie Octave Chamber presents with dental pain.  Patient with toothache.  No gross abscess.  Exam unconcerning for Ludwig's angina or spread of infection. Dental block with adequate relief of pain. Will treat with penicillin and pain medicine.  Urged patient to follow-up with dentist.  I have also discussed reasons to return immediately to the ER.  Patient expresses understanding and agrees with plan.   I  personally performed the services described in this documentation, which was scribed in my presence. The recorded information has been reviewed and is accurate.   Dahlia Client Amere Bricco, PA-C 08/10/12 [phone]

## 2012-08-09 NOTE — ED Notes (Signed)
C/o left lower toothl pain x 5 days.

## 2012-08-10 MED ORDER — OXYCODONE-ACETAMINOPHEN 5-325 MG PO TABS: 1.0000 | ORAL_TABLET | ORAL | Status: DC | PRN

## 2012-08-10 MED ORDER — PENICILLIN V POTASSIUM 500 MG PO TABS: 500.0000 mg | ORAL_TABLET | Freq: Four times a day (QID) | ORAL | Status: DC

## 2012-08-10 MED ORDER — BUPIVACAINE-EPINEPHRINE PF 0.5-1:200000 % IJ SOLN
1.8000 mL | Freq: Once | INTRAMUSCULAR | Status: AC
  Administered 2012-08-10: 9 mg
  Filled 2012-08-10: qty 1.8

## 2012-08-10 NOTE — ED Provider Notes (Signed)
Medical screening examination/treatment/procedure(s) were performed by non-physician practitioner and as supervising physician I was immediately available for consultation/collaboration.   Jullisa Grigoryan, MD 08/10/12 0958 

## 2014-05-27 ENCOUNTER — Other Ambulatory Visit (HOSPITAL_COMMUNITY)
Admission: RE | Admit: 2014-05-27 | Discharge: 2014-05-27 | Disposition: A | Discharge status: Home / Self Care | Payer: Medicaid Other | Source: Ambulatory Visit | Attending: Family Medicine | Admitting: Family Medicine

## 2014-05-27 ENCOUNTER — Encounter (HOSPITAL_COMMUNITY): Payer: Self-pay | Admitting: Emergency Medicine

## 2014-05-27 ENCOUNTER — Emergency Department (HOSPITAL_COMMUNITY)
Admission: EM | Admit: 2014-05-27 | Discharge: 2014-05-27 | Disposition: A | Discharge status: Home / Self Care | Payer: Medicaid Other | Source: Home / Self Care | Attending: Family Medicine | Admitting: Family Medicine

## 2014-05-27 DIAGNOSIS — N760 Acute vaginitis: Secondary | ICD-10-CM | POA: Insufficient documentation

## 2014-05-27 DIAGNOSIS — Z113 Encounter for screening for infections with a predominantly sexual mode of transmission: Secondary | ICD-10-CM | POA: Diagnosis not present

## 2014-05-27 DIAGNOSIS — N898 Other specified noninflammatory disorders of vagina: Secondary | ICD-10-CM

## 2014-05-27 DIAGNOSIS — L298 Other pruritus: Secondary | ICD-10-CM

## 2014-05-27 LAB — POCT URINALYSIS DIP (DEVICE)
Bilirubin Urine: NEGATIVE
Glucose, UA: NEGATIVE mg/dL
Ketones, ur: NEGATIVE mg/dL
Nitrite: NEGATIVE
PROTEIN: NEGATIVE mg/dL
Specific Gravity, Urine: 1.02 (ref 1.005–1.030)
Urobilinogen, UA: 0.2 mg/dL (ref 0.0–1.0)
pH: 6.5 (ref 5.0–8.0)

## 2014-05-27 LAB — POCT PREGNANCY, URINE: PREG TEST UR: NEGATIVE

## 2014-05-27 MED ORDER — METRONIDAZOLE 500 MG PO TABS: 500.0000 mg | ORAL_TABLET | Freq: Two times a day (BID) | ORAL | Status: DC

## 2014-05-27 NOTE — Discharge Instructions (Signed)
Advise follow up at [facility] if symptoms do not improve and for repeat HIV testing in 2-3 mos. No sex x 2 weeks and only if symptoms have resolved. Use condoms. Advised partner of need for treatment.

## 2014-05-27 NOTE — ED Provider Notes (Signed)
CSN: [id number]     Arrival date & time 05/27/14  1010 History   First MD Initiated Contact with Patient 05/27/14 1103     Chief Complaint  Patient presents with  . Vaginal Itching   (Consider location/radiation/quality/duration/timing/severity/associated sxs/prior Treatment) HPI Comments: Patient presents for evaluation of two weeks of vaginal itching without associated discharge or irregular vaginal bleeding. Endorses that she was treated for trichomonas in Nov. 9323 and is uncertain if partner was treated.   The history is provided by the patient.    History reviewed. No pertinent past medical history. Past Surgical History  Procedure Laterality Date  . Cervical cerclage    . Tubal ligation     No family history on file. History  Substance Use Topics  . Smoking status: Current Every Day Smoker  . Smokeless tobacco: Not on file  . Alcohol Use: Yes     Comment: 2-3 times per week, 2 drinks per time   OB History    No data available     Review of Systems  Constitutional: Negative.   HENT: Negative for sore throat.   Gastrointestinal: Negative.   Genitourinary: Negative for dysuria, urgency, frequency, hematuria, flank pain, vaginal bleeding, vaginal discharge, difficulty urinating, genital sores, vaginal pain, menstrual problem, pelvic pain and dyspareunia.  Musculoskeletal: Negative for back pain.  Skin: Negative.     Allergies  Review of patient's allergies indicates no known allergies.  Home Medications   Prior to Admission medications   Medication Sig Start Date End Date Taking? Authorizing Provider  benzocaine (ORAJEL) 10 % mucosal gel Use as directed 1 application in the mouth or throat 3 (three) times daily as needed for pain.     Historical Provider, MD  ibuprofen (ADVIL,MOTRIN) 200 MG tablet Take 200 mg by mouth every 6 (six) hours as needed for pain.    Historical Provider, MD  metroNIDAZOLE (FLAGYL) 500 MG tablet Take 1 tablet (500 mg total) by mouth 2  (two) times daily. 05/27/14   Audelia Hives Latasia Silberstein, PA  naproxen sodium (ANAPROX) 220 MG tablet Take 220 mg by mouth 2 (two) times daily as needed (for pain).     Historical Provider, MD  OVER THE COUNTER MEDICATION Take 1 application by mouth 2 (two) times daily as needed (for dental pain). "Bleach applied to tooth with q-tip"    Historical Provider, MD  oxyCODONE-acetaminophen (PERCOCET) 5-325 MG per tablet Take 1 tablet by mouth every 4 (four) hours as needed for pain. 08/10/12   Hannah Muthersbaugh, PA-C  penicillin v potassium (VEETID) 500 MG tablet Take 1 tablet (500 mg total) by mouth 4 (four) times daily. 08/10/12   Hannah Muthersbaugh, PA-C   BP 124/83 mmHg  Pulse 84  Temp(Src) 98.6 F (37 C) (Oral)  Resp 16  SpO2 100%  LMP 05/23/2014 Physical Exam  Constitutional: She is oriented to person, place, and time. She appears well-developed and well-nourished. No distress.  HENT:  Head: Normocephalic and atraumatic.  Eyes: Conjunctivae are normal. No scleral icterus.  Cardiovascular: Normal rate.   Pulmonary/Chest: Effort normal.  Abdominal: Soft. Normal appearance and bowel sounds are normal. There is no tenderness.  Genitourinary: Vagina normal and uterus normal. Pelvic exam was performed with patient supine. There is no rash, tenderness or lesion on the right labia. There is no rash, tenderness or lesion on the left labia. Cervix exhibits no motion tenderness, no discharge and no friability. Right adnexum displays no mass, no tenderness and no fullness. Left adnexum displays no mass,  no tenderness and no fullness.  Musculoskeletal: Normal range of motion.  Neurological: She is alert and oriented to person, place, and time.  Skin: Skin is warm and dry.  Psychiatric: She has a normal mood and affect. Her behavior is normal.  Nursing note and vitals reviewed.   ED Course  Procedures (including critical care time) Labs Review Labs Reviewed  POCT URINALYSIS DIP (DEVICE) - Abnormal;  Notable for the following:    Hgb urine dipstick TRACE (*)    Leukocytes, UA TRACE (*)    All other components within normal limits  RPR  HIV ANTIBODY (ROUTINE TESTING)  POCT PREGNANCY, URINE  CERVICOVAGINAL ANCILLARY ONLY    Imaging Review No results found.   MDM   1. Vaginal itching    Requests STI testing but declines empiric treatment for gonorrhea and chlamydia while at [facility]. Will provide Rx for 7 day course of flagyl given concerns for possible repeat trichomonas exposure. Advise follow up at [facility] if symptoms do not improve and for repeat HIV testing in 2-3 mos. No sex x 2 weeks and only if symptoms have resolved. Use condoms. Advised partner of need for treatment.   UPT neg UA unremarkable  Lutricia Feil, [place] 05/27/14 1146

## 2014-05-28 LAB — CERVICOVAGINAL ANCILLARY ONLY
CHLAMYDIA, DNA PROBE: NEGATIVE
NEISSERIA GONORRHEA: NEGATIVE
WET PREP (BD AFFIRM): POSITIVE — AB
Wet Prep (BD Affirm): NEGATIVE
Wet Prep (BD Affirm): NEGATIVE

## 2014-05-28 LAB — RPR: RPR Ser Ql: NONREACTIVE

## 2014-05-28 LAB — HIV ANTIBODY (ROUTINE TESTING W REFLEX): HIV Screen 4th Generation wRfx: NONREACTIVE

## 2014-05-28 NOTE — ED Notes (Signed)
GC/Chlamydia neg., Affirm: Candida and Trich neg., Gardnerella pos. Pt. adequately treated with Flagyl. Roselyn Meier 05/28/2014

## 2014-05-30 ENCOUNTER — Telehealth (HOSPITAL_COMMUNITY): Payer: Self-pay | Admitting: *Deleted

## 2014-05-30 NOTE — ED Notes (Addendum)
Pt. called for her lab results and wants to pick them up.  Pt. verified x 2 and given results.  Pt. told she was adequately treated with Flagyl for bacterial vaginosis. Pt. told she  needs to bring a picture ID and fill out the medical release form.  Pt. voiced understanding.  Labs printed and put at the front desk in a brown envelope. Roselyn Meier 05/30/2014

## 2014-09-15 ENCOUNTER — Emergency Department (HOSPITAL_COMMUNITY)
Admission: EM | Admit: 2014-09-15 | Discharge: 2014-09-15 | Disposition: A | Discharge status: Home / Self Care | Payer: Medicaid Other | Attending: Emergency Medicine | Admitting: Emergency Medicine

## 2014-09-15 ENCOUNTER — Emergency Department (HOSPITAL_COMMUNITY): Payer: Medicaid Other

## 2014-09-15 ENCOUNTER — Encounter (HOSPITAL_COMMUNITY): Payer: Self-pay | Admitting: Family Medicine

## 2014-09-15 DIAGNOSIS — Y9289 Other specified places as the place of occurrence of the external cause: Secondary | ICD-10-CM | POA: Insufficient documentation

## 2014-09-15 DIAGNOSIS — Z79899 Other long term (current) drug therapy: Secondary | ICD-10-CM | POA: Insufficient documentation

## 2014-09-15 DIAGNOSIS — Z720 Tobacco use: Secondary | ICD-10-CM | POA: Insufficient documentation

## 2014-09-15 DIAGNOSIS — Y998 Other external cause status: Secondary | ICD-10-CM | POA: Diagnosis not present

## 2014-09-15 DIAGNOSIS — Z792 Long term (current) use of antibiotics: Secondary | ICD-10-CM | POA: Insufficient documentation

## 2014-09-15 DIAGNOSIS — S8992XA Unspecified injury of left lower leg, initial encounter: Secondary | ICD-10-CM | POA: Diagnosis not present

## 2014-09-15 DIAGNOSIS — Y9389 Activity, other specified: Secondary | ICD-10-CM | POA: Insufficient documentation

## 2014-09-15 DIAGNOSIS — X58XXXA Exposure to other specified factors, initial encounter: Secondary | ICD-10-CM | POA: Diagnosis not present

## 2014-09-15 MED ORDER — NAPROXEN 500 MG PO TABS: 500.0000 mg | ORAL_TABLET | Freq: Two times a day (BID) | ORAL | Status: DC

## 2014-09-15 MED ORDER — HYDROCODONE-ACETAMINOPHEN 5-325 MG PO TABS: 1.0000 | ORAL_TABLET | Freq: Four times a day (QID) | ORAL | Status: DC | PRN

## 2014-09-15 MED ORDER — HYDROCODONE-ACETAMINOPHEN 5-325 MG PO TABS: 1.0000 | ORAL_TABLET | Freq: Once | ORAL | Status: DC

## 2014-09-15 NOTE — ED Notes (Signed)
Call Pt Phone numbers listed no answer.

## 2014-09-15 NOTE — ED Notes (Signed)
Pt call x2 from front lobby no answer.

## 2014-09-15 NOTE — ED Notes (Signed)
Pt called x2 from front lobby. No answer.

## 2014-09-15 NOTE — ED Provider Notes (Signed)
CSN: [id number]     Arrival date & time 09/15/14  1141 History  This chart was scribed for Debroah Baller, NP working with Dorie Rank, MD by Randa Evens, ED Scribe. This patient was seen in room TR02C/TR02C and the patient's care was started at 1:17 PM.      Chief Complaint  Patient presents with  . Knee Pain   Patient is a 34 y.o. female presenting with knee pain. The history is provided by the patient. No language interpreter was used.  Knee Pain Location:  Knee Time since incident:  3 days Knee location:  L knee Pain details:    Radiates to:  L leg   Severity:  Mild   Duration:  3 days Chronicity:  New Relieved by:  NSAIDs and ice Associated symptoms: no numbness and no tingling    HPI Comments: Nicole [name] is a 34 y.o. female who presents to the Emergency Department complaining of new left knee pain onset 2 days prior. Pt states she was crawling across the seats in her car and hit it on a piece a metal in the car. Pt states that the knee is swollen. Pt states she is having pain radiating down the anterior aspect of her left leg. Pt states that the pain has been worse since wearing heals yesterday. Pt states she has tried ibuprofen that only provides temporary relief. Pt has also been applying ice with no relief. Denies hip pain, numbness, tingling or other related symptoms.     History reviewed. No pertinent past medical history. Past Surgical History  Procedure Laterality Date  . Cervical cerclage    . Tubal ligation     History reviewed. No pertinent family history. History  Substance Use Topics  . Smoking status: Current Every Day Smoker  . Smokeless tobacco: Not on file  . Alcohol Use: Yes     Comment: 2-3 times per week, 2 drinks per time   OB History    No data available      Review of Systems  Musculoskeletal: Positive for joint swelling and arthralgias.       Left knee pain   Neurological: Negative for numbness.  All other systems reviewed and are  negative.    Allergies  Review of patient's allergies indicates no known allergies.  Home Medications   Prior to Admission medications   Medication Sig Start Date End Date Taking? Authorizing Provider  benzocaine (ORAJEL) 10 % mucosal gel Use as directed 1 application in the mouth or throat 3 (three) times daily as needed for pain.     Historical Provider, MD  HYDROcodone-acetaminophen (NORCO) 5-325 MG per tablet Take 1 tablet by mouth every 6 (six) hours as needed for moderate pain. 09/15/14   Hope Bunnie Pion, NP  ibuprofen (ADVIL,MOTRIN) 200 MG tablet Take 200 mg by mouth every 6 (six) hours as needed for pain.    Historical Provider, MD  metroNIDAZOLE (FLAGYL) 500 MG tablet Take 1 tablet (500 mg total) by mouth 2 (two) times daily. 05/27/14   Audelia Hives Presson, PA  naproxen (NAPROSYN) 500 MG tablet Take 1 tablet (500 mg total) by mouth 2 (two) times daily. 09/15/14   Hope Bunnie Pion, NP  naproxen sodium (ANAPROX) 220 MG tablet Take 220 mg by mouth 2 (two) times daily as needed (for pain).     Historical Provider, MD  OVER THE COUNTER MEDICATION Take 1 application by mouth 2 (two) times daily as needed (for dental pain). "Bleach applied to  tooth with q-tip"    Historical Provider, MD  oxyCODONE-acetaminophen (PERCOCET) 5-325 MG per tablet Take 1 tablet by mouth every 4 (four) hours as needed for pain. 08/10/12   Hannah Muthersbaugh, PA-C  penicillin v potassium (VEETID) 500 MG tablet Take 1 tablet (500 mg total) by mouth 4 (four) times daily. 08/10/12   Hannah Muthersbaugh, PA-C   BP 119/71 mmHg  Pulse 81  Temp(Src) 98.6 F (37 C)  Resp 18  Ht 5\' 6"  (1.676 m)  Wt 203 lb 8 oz (92.307 kg)  BMI 32.86 kg/m2  SpO2 98%  LMP 08/29/2014   Physical Exam  Constitutional: She is oriented to person, place, and time. She appears well-developed and well-nourished. No distress.  HENT:  Head: Normocephalic and atraumatic.  Eyes: Conjunctivae and EOM are normal.  Neck: Neck supple.  Cardiovascular:  Normal rate.   Pulmonary/Chest: Effort normal. No respiratory distress.  Musculoskeletal: Normal range of motion. She exhibits tenderness.  Tenderness to palpation to anterior aspect of left knee just below the patella, DP 2+ adequate circulation, sensation intact, full passive ROM without difficulty.  Neurological: She is alert and oriented to person, place, and time.  Skin: Skin is warm and dry.  Psychiatric: She has a normal mood and affect. Her behavior is normal.  Nursing note and vitals reviewed.   ED Course  Procedures (including critical care time) DIAGNOSTIC STUDIES: Oxygen Saturation is 98% on RA, normal by my interpretation.    COORDINATION OF CARE: 1:27 PM-Discussed treatment plan with pt at bedside and pt agreed to plan.  2:18 PM- Went to recheck with Pt to discuss discharge plan and pt left.     Labs Review Labs Reviewed - No data to display  Imaging Review Dg Knee Complete 4 Views Left  09/15/2014   CLINICAL DATA:  Hit LEFT knee on a metal bar in her car 2 days ago, felt knee give out while walking down stairs 1 day ago, pain throughout LEFT knee radiating to foot  EXAM: LEFT KNEE - COMPLETE 4+ VIEW  COMPARISON:  None  FINDINGS: Bone mineralization normal.  Joint spaces preserved.  No fracture, dislocation, or bone destruction.  No joint effusion.  IMPRESSION: Normal exam.   Electronically Signed   By: Lavonia Dana M.D.   On: 09/15/2014 13:53    MDM  34 y.o. female with left knee pain s/p injury 3 days prior to ED visit. Stable for d/c without neurovascular compromise and normal x-ray. Discussed with the patient clinical and x-ray findings and plan of care. All questioned fully answered. She will return if any problems arise.  Final diagnoses:  Knee injury, left, initial encounter   I personally performed the services described in this documentation, which was scribed in my presence. The recorded information has been reviewed and is accurate.    [address] [place],  NP 09/16/14 2015  Dorie Rank, MD 09/17/14 [phone]

## 2014-09-15 NOTE — ED Notes (Signed)
Pt answered call from lobby

## 2014-09-15 NOTE — ED Notes (Signed)
Pt left before meds were given and before D/C papers were given

## 2014-09-15 NOTE — ED Notes (Signed)
Attempted to call Pt to reports D/C left and may return to pick up RX left for her. Attempted a call to PT Home and Cell no answer.

## 2014-09-15 NOTE — ED Notes (Signed)
Pt here for left knee pain and swollen. sts she hit on metal ice in her car when crawling through. sts her knee almost gave out on her yesterday when she was walking in heels.

## 2015-10-08 ENCOUNTER — Encounter (HOSPITAL_COMMUNITY): Payer: Self-pay | Admitting: Emergency Medicine

## 2015-10-08 ENCOUNTER — Ambulatory Visit (HOSPITAL_COMMUNITY)
Admission: EM | Admit: 2015-10-08 | Discharge: 2015-10-08 | Disposition: A | Discharge status: Home / Self Care | Payer: Medicaid Other | Attending: Emergency Medicine | Admitting: Emergency Medicine

## 2015-10-08 DIAGNOSIS — F17200 Nicotine dependence, unspecified, uncomplicated: Secondary | ICD-10-CM | POA: Insufficient documentation

## 2015-10-08 DIAGNOSIS — Z202 Contact with and (suspected) exposure to infections with a predominantly sexual mode of transmission: Secondary | ICD-10-CM

## 2015-10-08 DIAGNOSIS — R109 Unspecified abdominal pain: Secondary | ICD-10-CM | POA: Diagnosis not present

## 2015-10-08 DIAGNOSIS — Z113 Encounter for screening for infections with a predominantly sexual mode of transmission: Secondary | ICD-10-CM | POA: Insufficient documentation

## 2015-10-08 DIAGNOSIS — Z9889 Other specified postprocedural states: Secondary | ICD-10-CM | POA: Insufficient documentation

## 2015-10-08 DIAGNOSIS — Z79899 Other long term (current) drug therapy: Secondary | ICD-10-CM | POA: Diagnosis not present

## 2015-10-08 DIAGNOSIS — Z711 Person with feared health complaint in whom no diagnosis is made: Secondary | ICD-10-CM

## 2015-10-08 LAB — POCT URINALYSIS DIP (DEVICE)
Bilirubin Urine: NEGATIVE
Glucose, UA: NEGATIVE mg/dL
Ketones, ur: NEGATIVE mg/dL
Leukocytes, UA: NEGATIVE
Nitrite: NEGATIVE
PH: 5.5 (ref 5.0–8.0)
PROTEIN: NEGATIVE mg/dL
SPECIFIC GRAVITY, URINE: 1.025 (ref 1.005–1.030)
UROBILINOGEN UA: 0.2 mg/dL (ref 0.0–1.0)

## 2015-10-08 LAB — POCT PREGNANCY, URINE: Preg Test, Ur: NEGATIVE

## 2015-10-08 NOTE — ED Notes (Signed)
The patient presented to the [facility] with a complaint of abdominal pain and her partner possibly being exposed to trichomonas.

## 2015-10-08 NOTE — ED Provider Notes (Signed)
CSN: [id number]     Arrival date & time 10/08/15  1511 History   First MD Initiated Contact with Patient 10/08/15 1543     Chief Complaint  Patient presents with  . Abdominal Pain  . SEXUALLY TRANSMITTED DISEASE   (Consider location/radiation/quality/duration/timing/severity/associated sxs/prior Treatment) HPI History obtained from patient: Location:  Vagina Context/Duration: Several days  Severity: No pain  Quality: Copious discharge Timing:           Constant Home Treatment: None Associated symptoms:  None Family History: Hypertension    History reviewed. No pertinent past medical history. Past Surgical History  Procedure Laterality Date  . Cervical cerclage    . Tubal ligation     History reviewed. No pertinent family history. Social History  Substance Use Topics  . Smoking status: Current Every Day Smoker  . Smokeless tobacco: None  . Alcohol Use: Yes     Comment: 2-3 times per week, 2 drinks per time   OB History    No data available     Review of Systems  Denies: HEADACHE, NAUSEA, ABDOMINAL PAIN, CHEST PAIN, CONGESTION, DYSURIA, SHORTNESS OF BREATH  Allergies  Review of patient's allergies indicates no known allergies.  Home Medications   Prior to Admission medications   Medication Sig Start Date End Date Taking? Authorizing Provider  benzocaine (ORAJEL) 10 % mucosal gel Use as directed 1 application in the mouth or throat 3 (three) times daily as needed for pain.     Historical Provider, MD  HYDROcodone-acetaminophen (NORCO) 5-325 MG per tablet Take 1 tablet by mouth every 6 (six) hours as needed for moderate pain. 09/15/14   Hope Bunnie Pion, NP  ibuprofen (ADVIL,MOTRIN) 200 MG tablet Take 200 mg by mouth every 6 (six) hours as needed for pain.    Historical Provider, MD  metroNIDAZOLE (FLAGYL) 500 MG tablet Take 1 tablet (500 mg total) by mouth 2 (two) times daily. 05/27/14   Audelia Hives Presson, PA  naproxen (NAPROSYN) 500 MG tablet Take 1 tablet (500 mg  total) by mouth 2 (two) times daily. 09/15/14   Hope Bunnie Pion, NP  naproxen sodium (ANAPROX) 220 MG tablet Take 220 mg by mouth 2 (two) times daily as needed (for pain).     Historical Provider, MD  OVER THE COUNTER MEDICATION Take 1 application by mouth 2 (two) times daily as needed (for dental pain). "Bleach applied to tooth with q-tip"    Historical Provider, MD  oxyCODONE-acetaminophen (PERCOCET) 5-325 MG per tablet Take 1 tablet by mouth every 4 (four) hours as needed for pain. 08/10/12   Hannah Muthersbaugh, PA-C  penicillin v potassium (VEETID) 500 MG tablet Take 1 tablet (500 mg total) by mouth 4 (four) times daily. 08/10/12   Hannah Muthersbaugh, PA-C   Meds Ordered and Administered this Visit  Medications - No data to display  BP 139/98 mmHg  Pulse 79  Temp(Src) 98.3 F (36.8 C) (Oral)  Resp 20  SpO2 97%  LMP 09/28/2015 (Exact Date) No data found.   Physical Exam NURSES NOTES AND VITAL SIGNS REVIEWED. CONSTITUTIONAL: Well developed, well nourished, no acute distress HEENT: normocephalic, atraumatic EYES: Conjunctiva normal NECK:normal ROM, supple, no adenopathy PULMONARY:No respiratory distress, normal effort ABDOMINAL: Soft, ND, NT BS+, No CVAT MUSCULOSKELETAL: Normal ROM of all extremities,  SKIN: warm and dry without rash PSYCHIATRIC: Mood and affect, behavior are normal  ED Course  Procedures (including critical care time)  Labs Review Labs Reviewed  POCT URINALYSIS DIP (DEVICE) - Abnormal; Notable for the  following:    Hgb urine dipstick TRACE (*)    All other components within normal limits  POCT PREGNANCY, URINE  URINE CYTOLOGY ANCILLARY ONLY   Cultures pending  Imaging Review No results found.   Visual Acuity Review  Right Eye Distance:   Left Eye Distance:   Bilateral Distance:    Right Eye Near:   Left Eye Near:    Bilateral Near:      Total Visit Time:15 MINUTES "GREATER THAN 50% WAS SPENT IN COUNSELING AND COORDINATION OF CARE WITH THE  PATIENT" DISCUSSION OF SAFE SEX, STD TREATMENT WAS PROVIDED. WILL AWAIT TREATMENT PENDING CULTURE RESULT    MDM   1. Concern about STD in female without diagnosis     Patient is reassured that there are no issues that require transfer to higher level of care at this time or additional tests. Patient is advised to continue home symptomatic treatment. Patient is advised that if there are new or worsening symptoms to attend the emergency department, contact primary care provider, or return to UC. Instructions of care provided discharged home in stable condition.    THIS NOTE WAS GENERATED USING A VOICE RECOGNITION SOFTWARE PROGRAM. ALL REASONABLE EFFORTS  WERE MADE TO PROOFREAD THIS DOCUMENT FOR ACCURACY.  I have verbally reviewed the discharge instructions with the patient. A printed AVS was given to the patient.  All questions were answered prior to discharge.      Konrad Felix, [place] 10/08/15 1851

## 2015-10-08 NOTE — Discharge Instructions (Signed)
Safe Sex Safe sex is about reducing the risk of giving or getting a sexually transmitted disease (STD). STDs are spread through sexual contact involving the genitals, mouth, or rectum. Some STDs can be cured and others cannot. Safe sex can also prevent unintended pregnancies.  WHAT ARE SOME SAFE SEX PRACTICES?  Limit your sexual activity to only one partner who is having sex with only you.  Talk to your partner about his or her past partners, past STDs, and drug use.  Use a condom every time you have sexual intercourse. This includes vaginal, oral, and anal sexual activity. Both females and males should wear condoms during oral sex. Only use latex or polyurethane condoms and water-based lubricants. Using petroleum-based lubricants or oils to lubricate a condom will weaken the condom and increase the chance that it will break. The condom should be in place from the beginning to the end of sexual activity. Wearing a condom reduces, but does not completely eliminate, your risk of getting or giving an STD. STDs can be spread by contact with infected body fluids and skin.  Get vaccinated for hepatitis B and HPV.  Avoid alcohol and recreational drugs, which can affect your judgment. You may forget to use a condom or participate in high-risk sex.  For females, avoid douching after sexual intercourse. Douching can spread an infection farther into the reproductive tract.  Check your body for signs of sores, blisters, rashes, or unusual discharge. See your health care provider if you notice any of these signs.  Avoid sexual contact if you have symptoms of an infection or are being treated for an STD. If you or your partner has herpes, avoid sexual contact when blisters are present. Use condoms at all other times.  If you are at risk of being infected with HIV, it is recommended that you take a prescription medicine daily to prevent HIV infection. This is called pre-exposure prophylaxis (PrEP). You are  considered at risk if:  You are a man who has sex with other men (MSM).  You are a heterosexual man or woman who is sexually active with more than one partner.  You take drugs by injection.  You are sexually active with a partner who has HIV.  Talk with your health care provider about whether you are at high risk of being infected with HIV. If you choose to begin PrEP, you should first be tested for HIV. You should then be tested every 3 months for as long as you are taking PrEP.  See your health care provider for regular screenings, exams, and tests for other STDs. Before having sex with a new partner, each of you should be screened for STDs and should talk about the results with each other. WHAT ARE THE BENEFITS OF SAFE SEX?   There is less chance of getting or giving an STD.  You can prevent unwanted or unintended pregnancies.  By discussing safe sex concerns with your partner, you may increase feelings of intimacy, comfort, trust, and honesty between the two of you.   This information is not intended to replace advice given to you by your health care provider. Make sure you discuss any questions you have with your health care provider.   Document Released: 05/05/2004 Document Revised: 04/18/2014 Document Reviewed: 09/19/2011 Elsevier Interactive Patient Education [employer].

## 2015-10-09 ENCOUNTER — Telehealth (HOSPITAL_COMMUNITY): Payer: Self-pay | Admitting: Emergency Medicine

## 2015-10-09 LAB — URINE CYTOLOGY ANCILLARY ONLY
CHLAMYDIA, DNA PROBE: NEGATIVE
Neisseria Gonorrhea: NEGATIVE
TRICH (WINDOWPATH): POSITIVE — AB

## 2015-10-09 MED ORDER — METRONIDAZOLE 500 MG PO TABS: 500.0000 mg | ORAL_TABLET | Freq: Two times a day (BID) | ORAL | Status: DC

## 2015-10-09 NOTE — ED Notes (Signed)
Pt called needing lab results from visit on 6/29... Adv pt she came back [facility] for Trich Gon/Chlam were negative... Per Lucita Lora, PA... Called in Flagyl 500 mg BID #14 w/no refills Per pt.... Called into [facility] ([facility] [place]) Gave pt info on safe sex and adv her to notify partner(s) Pt verb understanding.

## 2015-10-27 ENCOUNTER — Inpatient Hospital Stay (HOSPITAL_COMMUNITY)
Admission: AD | Admit: 2015-10-27 | Discharge: 2015-10-27 | Discharge status: Against Medical Advice | Payer: Medicaid Other | Source: Ambulatory Visit | Attending: Family Medicine | Admitting: Family Medicine

## 2015-10-27 DIAGNOSIS — Z049 Encounter for examination and observation for unspecified reason: Secondary | ICD-10-CM | POA: Diagnosis present

## 2015-10-27 LAB — URINALYSIS, ROUTINE W REFLEX MICROSCOPIC
Bilirubin Urine: NEGATIVE
GLUCOSE, UA: NEGATIVE mg/dL
Hgb urine dipstick: NEGATIVE
KETONES UR: NEGATIVE mg/dL
Leukocytes, UA: NEGATIVE
NITRITE: NEGATIVE
PH: 5.5 (ref 5.0–8.0)
Protein, ur: NEGATIVE mg/dL
Specific Gravity, Urine: >1.03 — ABNORMAL HIGH (ref 1.005–1.030)

## 2015-10-27 LAB — POCT PREGNANCY, URINE: Preg Test, Ur: NEGATIVE

## 2015-10-27 NOTE — MAU Note (Signed)
Pt informed registration that she is leaving, did not wait to sign AMA paper.

## 2015-10-27 NOTE — MAU Note (Signed)
Pt C/O clear discharge coming from breast 2 days ago, breasts & nipples are painful.  LMP 6/24.  Negative HPT on 7/12.  Also C/O LLQ cramping also for the last 2 days.  Has BTL.

## 2016-10-18 ENCOUNTER — Encounter (HOSPITAL_COMMUNITY): Payer: Self-pay | Admitting: Emergency Medicine

## 2016-10-18 ENCOUNTER — Ambulatory Visit (HOSPITAL_COMMUNITY)
Admission: EM | Admit: 2016-10-18 | Discharge: 2016-10-18 | Disposition: A | Discharge status: Home / Self Care | Payer: Medicaid Other | Attending: Family Medicine | Admitting: Family Medicine

## 2016-10-18 DIAGNOSIS — B9689 Other specified bacterial agents as the cause of diseases classified elsewhere: Secondary | ICD-10-CM | POA: Diagnosis not present

## 2016-10-18 DIAGNOSIS — B373 Candidiasis of vulva and vagina: Secondary | ICD-10-CM | POA: Diagnosis not present

## 2016-10-18 DIAGNOSIS — B3731 Acute candidiasis of vulva and vagina: Secondary | ICD-10-CM

## 2016-10-18 DIAGNOSIS — N898 Other specified noninflammatory disorders of vagina: Secondary | ICD-10-CM | POA: Diagnosis present

## 2016-10-18 DIAGNOSIS — A5901 Trichomonal vulvovaginitis: Secondary | ICD-10-CM | POA: Insufficient documentation

## 2016-10-18 MED ORDER — FLUCONAZOLE 150 MG PO TABS: 150.0000 mg | ORAL_TABLET | ORAL | 0 refills | Status: DC

## 2016-10-18 NOTE — ED Triage Notes (Signed)
The patient presented to the [facility] with a complaint of vaginal itching that started yesterday.

## 2016-10-18 NOTE — ED Provider Notes (Signed)
[place]    CSN: [id number] Arrival date & time: 10/18/16  1159     History   Chief Complaint Chief Complaint  Patient presents with  . Vaginal Itching    HPI Nicole [name] is a 36 y.o. female presenting for vaginal itching and thick white discharge.   For 2 weeks, she's had increasing discharge with itching. Just finished menstruating, has h/o tubal ligation. Sexually active with one asymptomatic female partner. No dysuria. No recent medications/abx. No fevers or abdominal pain.   HPI  History reviewed. No pertinent past medical history.  There are no active problems to display for this patient.   Past Surgical History:  Procedure Laterality Date  . CERVICAL CERCLAGE    . TUBAL LIGATION      OB History    No data available       Home Medications    Prior to Admission medications   Medication Sig Start Date End Date Taking? Authorizing Provider  fluconazole (DIFLUCAN) 150 MG tablet Take 1 tablet (150 mg total) by mouth every 3 (three) days. 10/18/16   [name], MD    Family History History reviewed. No pertinent family history.  Social History Social History  Substance Use Topics  . Smoking status: Current Every Day Smoker  . Smokeless tobacco: Not on file  . Alcohol use Yes     Comment: 2-3 times per week, 2 drinks per time     Allergies   Patient has no known allergies.   Review of Systems Review of Systems As above Physical Exam Triage Vital Signs ED Triage Vitals  Enc Vitals Group     BP 10/18/16 1224 (!) 145/87     Pulse Rate 10/18/16 1224 100     Resp 10/18/16 1224 18     Temp 10/18/16 1224 98.3 F (36.8 C)     Temp Source 10/18/16 1224 Oral     SpO2 10/18/16 1224 100 %     Weight --      Height --      Head Circumference --      Peak Flow --      Pain Score 10/18/16 1220 7     Pain Loc --      Pain Edu? --      Excl. in [place]? --    No data found.   Updated Vital Signs BP (!) 145/87 (BP Location: Right  Arm)   Pulse 100   Temp 98.3 F (36.8 C) (Oral)   Resp 18   LMP 10/18/2016 (Exact Date)   SpO2 100%   Visual Acuity Right Eye Distance:   Left Eye Distance:   Bilateral Distance:    Right Eye Near:   Left Eye Near:    Bilateral Near:     Physical Exam BP (!) 145/87 (BP Location: Right Arm)   Pulse 100   Temp 98.3 F (36.8 C) (Oral)   Resp 18   LMP 10/18/2016 (Exact Date)   SpO2 100%  Gen: Well-appearing 36 y.o.female in NAD  Pelvic: Normal external female genitalia without lesions. Vaginal mucosa and cervix normal without lesions. Copious thick white discharge present without bleeding noted on speculum exam. No cervical motion tenderness.   Maudie Mercury, RN present throughout duration of exam.    UC Treatments / Results  Labs (all labs ordered are listed, but only abnormal results are displayed) Labs Reviewed - No data to display  EKG  EKG Interpretation None      Radiology  No results found.  Procedures Procedures (including critical care time)  Medications Ordered in UC Medications - No data to display  Initial Impression / Assessment and Plan / UC Course  I have reviewed the triage vital signs and the nursing notes.  Pertinent labs & imaging results that were available during my care of the patient were reviewed by me and considered in my medical decision making (see chart for details).  Final Clinical Impressions(s) / UC Diagnoses   Final diagnoses:  Vulvovaginal candidiasis   Clinically consistent with yeast infection. Will treat empirically and send sample for wet prep, GC, Chl. Will call with results. Return precautions advised.   New Prescriptions New Prescriptions   FLUCONAZOLE (DIFLUCAN) 150 MG TABLET    Take 1 tablet (150 mg total) by mouth every 3 (three) days.     [name], MD 10/18/16 1351

## 2016-10-20 LAB — CERVICOVAGINAL ANCILLARY ONLY
Bacterial vaginitis: NEGATIVE
Candida vaginitis: POSITIVE — AB
Chlamydia: NEGATIVE
NEISSERIA GONORRHEA: NEGATIVE
TRICH (WINDOWPATH): POSITIVE — AB

## 2016-10-21 ENCOUNTER — Telehealth (HOSPITAL_COMMUNITY): Payer: Self-pay | Admitting: Internal Medicine

## 2016-10-21 MED ORDER — METRONIDAZOLE 500 MG PO TABS: 2000.0000 mg | ORAL_TABLET | Freq: Once | ORAL | 0 refills | Status: AC

## 2016-10-21 NOTE — Telephone Encounter (Signed)
Clinical staff, please let the patient know that test for trichomonas was positive.  Rx metronidazole was sent to the pharmacy of record, [facility] on [address].  Please refrain from sexual intercourse for 7 days to give the medicine time to work.  Sexual partners need to be notified and tested/treated.  Condoms may reduce risk of reinfection.   Test for candida (yeast) was also positive.  Rx fluconazole was given at the urgent care visit 7/10.  Recheck for further evaluation if symptoms are not improving.   LM

## 2017-03-16 ENCOUNTER — Ambulatory Visit: Payer: Medicaid Other | Attending: Orthopedic Surgery

## 2017-03-16 DIAGNOSIS — R600 Localized edema: Secondary | ICD-10-CM

## 2017-03-16 DIAGNOSIS — M25662 Stiffness of left knee, not elsewhere classified: Secondary | ICD-10-CM | POA: Diagnosis present

## 2017-03-16 DIAGNOSIS — M6281 Muscle weakness (generalized): Secondary | ICD-10-CM | POA: Diagnosis present

## 2017-03-16 DIAGNOSIS — R262 Difficulty in walking, not elsewhere classified: Secondary | ICD-10-CM

## 2017-03-16 DIAGNOSIS — M25562 Pain in left knee: Secondary | ICD-10-CM

## 2017-03-16 NOTE — Therapy (Signed)
[place], [place], [address] Phone: [phone]   Fax:  [phone]  Physical Therapy Evaluation  Patient Details  Name: Nicole [name] MRN: [id number] Date of Birth: 18-Jan-1981 Referring Provider: Hoy Register MD   Encounter Date: 03/16/2017  PT End of Session - 03/16/17 1535    Visit Number  1    Number of Visits  12    Date for PT Re-Evaluation  05/26/17    Authorization Type  Medicaid    PT Start Time  0255    PT Stop Time  0335    PT Time Calculation (min)  40 min    Activity Tolerance  Patient tolerated treatment well;No increased pain    Behavior During Therapy  [facility] for tasks assessed/performed       History reviewed. No pertinent past medical history.  Past Surgical History:  Procedure Laterality Date  . CERVICAL CERCLAGE    . TUBAL LIGATION      There were no vitals filed for this visit.   Subjective Assessment - 03/16/17 1500    Subjective  She reports SP LT ACL reconstruction. She was standing on  [place] an someone was bouncing and fell and rolled into LT knee. Stepped wrong a football game and may have torn meniscus . ACL was allograft, She is not sure about  meniscal repair versus debridement.  .      Limitations  Walking    How long can you sit comfortably?  1-2 hours    How long can you stand comfortably?  30 min    How long can you walk comfortably?  whole grocery store    Patient Stated Goals  She wants to be able to walk without limp and return to work     Currently in Pain?  No/denies after standing 30 min    Pain Score  4  prolonged standing    Pain Location  Knee    Pain Orientation  Left;Medial;Posterior    Pain Descriptors / Indicators  Aching;Burning;Sharp    Pain Type  Surgical pain    Pain Frequency  Constant    Aggravating Factors   standing    Pain Relieving Factors  rest    Multiple Pain Sites  No         [facility] PT Assessment - 03/16/17 0001       Assessment   Medical Diagnosis  LT ACL reconstruction    Referring Provider  Hoy Register MD    Onset Date/Surgical Date  -- 02/21/17    Next MD Visit  03/27/17    Prior Therapy  None      Precautions   Precaution Comments  ACL protocol      Restrictions   Weight Bearing Restrictions  No      Balance Screen   Has the patient fallen in the past 6 months  No    Has the patient had a decrease in activity level because of a fear of falling?   Yes post surgery      [place] residence    Living Arrangements  Children    Type of [place] Access  Level entry    [place]  Two level    Alternate Level Stairs-Number of Steps  13    Alternate Level Stairs-Rails  Right;Left;Can reach both    [place]      Prior  Function   Level of Independence  Independent    Vocation Requirements  stand , clean and cashiers,       Cognition   Overall Cognitive Status  Within Functional Limits for tasks assessed      Observation/Other Assessments-Edema    Edema  Circumferential      Circumferential Edema   Circumferential - Right  44 cm    Circumferential - Left   49 cm knee      ROM / Strength   AROM / PROM / Strength  PROM;AROM;Strength      AROM   AROM Assessment Site  Knee    Right/Left Knee  Right;Left    Right Knee Extension  0    Right Knee Flexion  130    Left Knee Extension  -35    Left Knee Flexion  85      PROM   PROM Assessment Site  Knee    Right/Left Knee  Left    Left Knee Extension  95    Left Knee Flexion  -20      Strength   Overall Strength Comments  RT LE WNL     Strength Assessment Site  Knee    Right/Left Knee  Right;Left    Right Knee Flexion  5/5    Right Knee Extension  5/5    Left Knee Flexion  4/5    Left Knee Extension  4/5             Objective measurements completed on examination: See above findings.                PT Short Term Goals - 03/16/17 1541       PT SHORT TERM GOAL #1   Title  She will be independent with inital HEP    Baseline  No progra    Time  4    Period  Weeks    Status  New      PT SHORT TERM GOAL #2   Title  She will improve active flexion to 100 degrees,, extension to -20 degrees    Baseline  flexion 85 degrees  extension -35 degrees    Time  4    Period  Weeks    Status  New      PT SHORT TERM GOAL #3   Title  She will be gin to walk in home without device    Baseline  Using single crutch in /out of home    Time  4    Period  Weeks    Status  New        PT Long Term Goals - 03/16/17 1543      PT LONG TERM GOAL #1   Title  She will be independent with all HEP issued     Baseline  Independent with initial HEP     Time  10    Period  Weeks    Status  New      PT LONG TERM GOAL #2   Title  She will walk with no  device 100% time      Baseline  using device out of home    Time  10    Period  Weeks    Status  New      PT LONG TERM GOAL #3   Title  She will be able to walk staris x15 step  step over step with one rail     Baseline  Need 2  rails or can't step over step    Time  10    Period  Weeks    Status  New      PT LONG TERM GOAL #4   Title  She will report she has plane to return top work at [place]  Not able to work    Time  10    Period  Weeks    Status  New      PT LONG TERM GOAL #5   Title  She will report no pain with general walking and home tasks    Baseline  Mild pain with standing and walking    Time  10    Period  Weeks    Status  New      Additional Long Term Goals   Additional Long Term Goals  Yes      PT LONG TERM GOAL #6   Title  She will be able to stand for 2 hours preping for return to work    Baseline  Not able to stand past 30 min    Time  [place] - 03/16/17 1535    Clinical Impression Statement  Ms Daily is post op meniscal reapir? and LT ACL reconstruction and  is doing well walking with 1 crutch  and essentially no pain. She is significnatly limited with ROM and quad weakness  and hads significant swelling in LT knee.   She has not been elevating or icing knee.   She should do well if she begins to stretch and ice/elevate for swelling    History and Personal Factors relevant to plan of care:  ACL tear and meniscal tear    Clinical Presentation  Evolving    Clinical Presentation due to:  minimal pain but significnat swelling and decr ROM, poor quads strengthin terminal range    Clinical Decision Making  Moderate    Rehab Potential  Good    PT Frequency  -- 3 visits     PT Duration  -- over 4 weeks then 12 visisst per allowance by Medicaid    PT Treatment/Interventions  Cryotherapy;Vasopneumatic Device;Lobbyist;Therapeutic exercise;Passive range of motion;Gait training;Stair training;Manual techniques;Taping    PT Next Visit Plan  REview HEP and progress if able .   Manual for ROM .   stengthening exer  all per ACL protocol in EPIC    PT Home Exercise Plan  Fexion stretching 30-60 sec, DS with TKE stretching , SAQ and hip strength SLR supine and side    Consulted and Agree with Plan of Care  Patient       Patient will benefit from skilled therapeutic intervention in order to improve the following deficits and impairments:  Pain, Impaired tone, Increased edema, Difficulty walking, Decreased activity tolerance, Decreased range of motion, Decreased strength  Visit Diagnosis: Localized edema - Plan: PT plan of care cert/re-cert  Stiffness of left knee, not elsewhere classified - Plan: PT plan of care cert/re-cert  Left knee pain, unspecified chronicity - Plan: PT plan of care cert/re-cert  Difficulty in walking, not elsewhere classified - Plan: PT plan of care cert/re-cert  Muscle weakness (generalized) - Plan: PT plan of care cert/re-cert     Problem List There are no active problems to display for this patient.   Darrel Hoover  PT 03/16/2017,  5:32  PM  [place], [place], [address] Phone: [phone]   Fax:  [phone]  Name: Nicole [name] MRN: [id number] Date of Birth: 02-Jun-1980

## 2017-03-27 ENCOUNTER — Ambulatory Visit: Payer: Medicaid Other

## 2017-03-27 DIAGNOSIS — M6281 Muscle weakness (generalized): Secondary | ICD-10-CM

## 2017-03-27 DIAGNOSIS — M25662 Stiffness of left knee, not elsewhere classified: Secondary | ICD-10-CM

## 2017-03-27 DIAGNOSIS — M25562 Pain in left knee: Secondary | ICD-10-CM

## 2017-03-27 DIAGNOSIS — R262 Difficulty in walking, not elsewhere classified: Secondary | ICD-10-CM

## 2017-03-27 DIAGNOSIS — R600 Localized edema: Secondary | ICD-10-CM | POA: Diagnosis not present

## 2017-03-27 NOTE — Therapy (Signed)
[facility] [address] [place], [place], [address] Phone: [phone]   Fax:  [phone]  Physical Therapy Treatment  Patient Details  Name: Nicole [name] MRN: [id number] Date of Birth: 12/12/1980 Referring Provider: William Craig MD   Encounter Date: 03/27/2017  PT End of Session - 03/27/17 0828    Visit Number  2    Number of Visits  12    Date for PT Re-Evaluation  05/26/17    Authorization Type  Medicaid    Authorization Time Period  1217/18 to 12 30/18    Authorization - Visit Number  1    Authorization - Number of Visits  2    PT Start Time  0800    PT Stop Time  0840    PT Time Calculation (min)  40 min    Activity Tolerance  Patient tolerated treatment well;No increased pain    Behavior During Therapy  WFL for tasks assessed/performed       History reviewed. No pertinent past medical history.  Past Surgical History:  Procedure Laterality Date  . CERVICAL CERCLAGE    . TUBAL LIGATION      There were no vitals filed for this visit.  Subjective Assessment - 03/27/17 0759    Subjective  Doing well with no pain today ( though walks with decr weight on involved leg)    Currently in Pain?  No/denies         [facility] PT Assessment - 03/27/17 0001      AROM   Left Knee Extension  -22    Left Knee Flexion  104      PROM   Left Knee Extension  -15 12/ 6 was -20    Left Knee Flexion  117 12/6 was 95                  [facility] Adult PT Treatment/Exercise - 03/27/17 0001      Neuro Re-ed    Neuro Re-ed Details   balance on tilt board forward and back and single leg stand with RT hip /leg arcs from forward to back x 15-20 each  then wporked on walking heel toe to improve gait and this was better if she slowed down and emphazied this with walking.       Exercises   Exercises  Knee/Hip      Knee/Hip Exercises: Aerobic   Recumbent Bike  L1 6 min      Knee/Hip Exercises: Standing   Heel Raises   Both;15 reps    Wall Squat  15 reps;5 seconds    Other Standing Knee Exercises  TKE ball press into wall      Knee/Hip Exercises: Seated   Long Arc Quad  Left;15 reps    Long Arc Quad Limitations  red band      Knee/Hip Exercises: Supine   Straight Leg Raises  Left;10 reps      Knee/Hip Exercises: Prone   Hamstring Curl  20 reps    Hamstring Curl Limitations  LT    Straight Leg Raises  Left;15 reps             PT Education - 03/27/17 0828    Education provided  Yes    Education Details  HEP    Person(s) Educated  Patient    Methods  Explanation;Demonstration;Tactile cues;Verbal cues;Handout    Comprehension  Returned demonstration;Verbalized understanding       PT Short Term Goals - 03/27/17 0847        PT SHORT TERM GOAL #1   Title  She will be independent with inital HEP    Status  Achieved      PT SHORT TERM GOAL #2   Title  She will improve active flexion to 100 degrees,, extension to -20 degrees    Baseline  103 and -22 this session    Status  Partially Met      PT SHORT TERM GOAL #3   Title  She will begin to walk in home without device    Status  Achieved        PT Long Term Goals - 03/27/17 0847      PT LONG TERM GOAL #1   Title  She will be independent with all HEP issued     Baseline  Independent with initial HEP     Status  On-going      PT LONG TERM GOAL #2   Title  She will walk with no  device 100% time      Status  Achieved      PT LONG TERM GOAL #3   Title  She will be able to walk staris x15 step  step over step with one rail     Status  Unable to assess      PT LONG TERM GOAL #4   Title  She will report she has plane to return top work at [employer]    Status  On-going      PT LONG TERM GOAL #5   Title  She will report no pain with general walking and home tasks    Baseline  occaisonal pain but genrally no pain    Status  Partially Met      PT LONG TERM GOAL #6   Title  She will be able to stand for 2 hours preping for return to  work    Status  Unable to assess            Plan - 03/27/17 0843    Clinical Impression Statement  Improvements made with ROM and pain minimal . She feels she is doing beter and report will continue to do her HEP daily at least. she uses clutch to drive and feels this is helping her leg. He ability to improve gait patter is god but she will have a limp for a while due to stiffness of knee. Does not use device to walk    PT Treatment/Interventions  Cryotherapy;Vasopneumatic Device;Electrical Stimulation;Balance training;Therapeutic exercise;Passive range of motion;Gait training;Stair training;Manual techniques;Taping    PT Next Visit Plan  REview HEP and progress if able .   Manual for ROM .   stengthening exer  all per ACL protocol in EPIC  she will be post 6 weeks nest visit     PT Home Exercise Plan  Flexion stretching 30-60 sec, DS with TKE stretching , SAQ and hip strength SLR supine and side, wall sit ,prone knee flexion, TKE , LAQ, leg press with band    Consulted and Agree with Plan of Care  Patient       Patient will benefit from skilled therapeutic intervention in order to improve the following deficits and impairments:  Pain, Impaired tone, Increased edema, Difficulty walking, Decreased activity tolerance, Decreased range of motion, Decreased strength  Visit Diagnosis: Localized edema  Stiffness of left knee, not elsewhere classified  Left knee pain, unspecified chronicity  Difficulty in walking, not elsewhere classified  Muscle weakness (generalized)     Problem List There   are no active problems to display for this patient.   Darrel Hoover  PT 03/27/2017, 8:48 AM  [facility] [address] [place], [place], [address] Phone: [phone]   Fax:  [phone]  Name: Nicole [name] MRN: [id number] Date of Birth: 09/12/1980

## 2017-03-27 NOTE — Addendum Note (Signed)
Addended by: Darrel Hoover on: 03/27/2017 09:39 AM   Modules accepted: Orders

## 2017-03-27 NOTE — Patient Instructions (Signed)
Issued from cabinet wall sit , LAQ with band , prone knee flexion, TKE standing , band leg press  All 2x/day 15-25 reps. Issued red blue and black band.

## 2017-03-30 ENCOUNTER — Encounter: Payer: Medicaid Other | Admitting: Physical Therapy

## 2017-04-10 ENCOUNTER — Encounter: Payer: Self-pay | Admitting: Physical Therapy

## 2017-04-10 ENCOUNTER — Ambulatory Visit: Payer: Medicaid Other | Admitting: Physical Therapy

## 2017-04-10 DIAGNOSIS — M25562 Pain in left knee: Secondary | ICD-10-CM

## 2017-04-10 DIAGNOSIS — R600 Localized edema: Secondary | ICD-10-CM

## 2017-04-10 DIAGNOSIS — R262 Difficulty in walking, not elsewhere classified: Secondary | ICD-10-CM

## 2017-04-10 DIAGNOSIS — M6281 Muscle weakness (generalized): Secondary | ICD-10-CM

## 2017-04-10 DIAGNOSIS — M25662 Stiffness of left knee, not elsewhere classified: Secondary | ICD-10-CM

## 2017-04-10 NOTE — Therapy (Signed)
[place], [place], [address] Phone: [phone]   Fax:  [phone]  Physical Therapy Treatment/RE-EVALUATION  Patient Details  Name: Nicole [name] MRN: [id number] Date of Birth: 18-Jun-1980 Referring Provider: Hoy Register MD   Encounter Date: 04/10/2017  PT End of Session - 04/10/17 1019    Visit Number  3    Number of Visits  12    Date for PT Re-Evaluation  05/26/17    Authorization Type  Medicaid    Authorization Time Period  will submit for more visits tiday    PT Start Time  1015    PT Stop Time  1050    PT Time Calculation (min)  35 min       History reviewed. No pertinent past medical history.  Past Surgical History:  Procedure Laterality Date  . CERVICAL CERCLAGE    . TUBAL LIGATION      There were no vitals filed for this visit.  Subjective Assessment - 04/10/17 1102    Subjective  No pain today. Doing well. She reports a slight limp ut no pain.     Currently in Pain?  No/denies         [facility] PT Assessment - 04/10/17 0001      Assessment   Medical Diagnosis  LT ACL reconstruction    Onset Date/Surgical Date  -- 02/21/17    Prior Therapy  None      AROM   Left Knee Extension  -8      PROM   Left Knee Extension  -12    Left Knee Flexion  120      Strength   Strength Assessment Site  Hip    Right/Left Hip  Right;Left    Right Hip Flexion  5/5    Left Hip Flexion  5/5    Right/Left Knee  Right;Left    Right Knee Flexion  5/5    Right Knee Extension  5/5    Left Knee Flexion  4+/5    Left Knee Extension  4+/5                  [facility] Adult PT Treatment/Exercise - 04/10/17 0001      Knee/Hip Exercises: Standing   Stairs  up and down 16 steps without handrails reciprocally and no pain.     Other Standing Knee Exercises  TKE ball press into wall      Knee/Hip Exercises: Seated   Long Arc Quad  Left;15 reps    Long Arc Quad Limitations  red band      Knee/Hip Exercises: Supine   Straight Leg Raises  Left;10 reps instruted in initial quad set                PT Short Term Goals - 04/10/17 1028      PT SHORT TERM GOAL #1   Title  She will be independent with inital HEP    Baseline  Pt reports compliance with current HEP 3-4 times per day, 7days a week     Time  4    Period  Weeks    Status  Achieved      PT SHORT TERM GOAL #2   Title  She will improve active flexion to 100 degrees,, extension to -20 degrees    Baseline  She measures 12-120 degrees left knee AROM today     Time  4    Period  Weeks    Status  Achieved      PT SHORT TERM GOAL #3   Title  She will begin to walk in home without device    Baseline  No longer using AD     Time  4    Period  Weeks    Status  Achieved        PT Long Term Goals - 04/10/17 1029      PT LONG TERM GOAL #1   Title  She will be independent with all HEP issued     Baseline  Performing current HEP 3-4 times oer day    Time  10    Period  Weeks    Status  On-going      PT LONG TERM GOAL #2   Title  She will walk with no  device 100% time      Baseline  No longer uses AD     Time  10    Period  Weeks    Status  Achieved      PT LONG TERM GOAL #3   Title  She will be able to walk staris x15 step  step over step with one rail     Baseline  no rails, able to ascend and descend 16 resiprocal stairs    Time  10    Period  Weeks    Status  Achieved      PT LONG TERM GOAL #4   Title  She will report she has plane to return top work at [employer] MD Jan 14th for possible return to work     Time  10    Status  On-going      PT [place] #5   Title  She will report no pain with general walking and home tasks    Baseline  Reports no pain with general walking and home tasks     Time  10    Period  Weeks    Status  Achieved      PT LONG TERM GOAL #6   Title  She will be able to stand for 2 hours preping for return to work    Baseline  She stood for  Holiday prep for 2 hours at home without increased pain.     Time  10    Period  Weeks    Status  Achieved            Plan - 04/10/17 1104    Clinical Impression Statement  Pt reports no pain with standing and cooking for 2 hours on Christmas Holiday. She reports no pain with daily walking or chores. She demonstrates a slight limp due to lacking full knee extension. He knee AROM have significantly improved however she still lasks 12 degrees of extension actively and 10 degrees passively. 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 She will benefit from skilled PT to maintain independence and functional mobility.     Rehab Potential  Good    PT Frequency  1x / week    PT Duration  6 weeks    PT Next Visit Plan  Review HEP and progress if able .   Manual for ROM .   stengthening exer  all per ACL protocol in EPIC      PT Home Exercise Plan  Flexion stretching  30-60 sec, DS with TKE stretching , SAQ and hip strength SLR supine and side, wall sit ,prone knee flexion, TKE , LAQ, leg press with band    Consulted and Agree with Plan of Care  Patient       Patient will benefit from skilled therapeutic intervention in order to improve the following deficits and impairments:  Pain, Impaired tone, Increased edema, Difficulty walking, Decreased activity tolerance, Decreased range of motion, Decreased strength  Visit Diagnosis: Localized edema  Stiffness of left knee, not elsewhere classified  Left knee pain, unspecified chronicity  Difficulty in walking, not elsewhere classified  Muscle weakness (generalized)     Problem List There are no active problems to display for this patient.   Ji Fairburn 04/10/2017, 12:23 PM  [place], [place], [address] Phone: [phone]   Fax:   [phone]  Name: Nicole [name] MRN: [id number] Date of Birth: [birth year]   Raeford Razor, PT 04/10/17 12:24 PM Phone: [phone] Fax: [phone]

## 2017-04-25 ENCOUNTER — Ambulatory Visit: Payer: Medicaid Other | Attending: Orthopedic Surgery

## 2017-04-25 DIAGNOSIS — R600 Localized edema: Secondary | ICD-10-CM | POA: Diagnosis not present

## 2017-04-25 DIAGNOSIS — M25662 Stiffness of left knee, not elsewhere classified: Secondary | ICD-10-CM | POA: Insufficient documentation

## 2017-04-25 DIAGNOSIS — M25562 Pain in left knee: Secondary | ICD-10-CM | POA: Insufficient documentation

## 2017-04-25 DIAGNOSIS — R262 Difficulty in walking, not elsewhere classified: Secondary | ICD-10-CM | POA: Diagnosis present

## 2017-04-25 DIAGNOSIS — M6281 Muscle weakness (generalized): Secondary | ICD-10-CM | POA: Diagnosis present

## 2017-04-25 NOTE — Patient Instructions (Signed)
Issued from cabinet knee flex /extension stretching 30-60 sec x 2-3 reps  2-3x/day,       And wall sits and step ups lateral and forward x 15-20 1x/day

## 2017-04-25 NOTE — Therapy (Signed)
[place], [place], [address] Phone: [phone]   Fax:  [phone]  Physical Therapy Treatment  Patient Details  Name: Nicole [name] MRN: [id number] Date of Birth: Sep 27, 1980 Referring Provider: Hoy Register MD   Encounter Date: 04/25/2017  PT End of Session - 04/25/17 1009    Visit Number  4    Number of Visits  12    Date for PT Re-Evaluation  05/26/17    Authorization Type  Medicaid    Authorization - Visit Number  1    Authorization - Number of Visits  3    PT Start Time  0930    PT Stop Time  1010    PT Time Calculation (min)  40 min    Activity Tolerance  Patient tolerated treatment well;No increased pain    Behavior During Therapy  [facility] for tasks assessed/performed       History reviewed. No pertinent past medical history.  Past Surgical History:  Procedure Laterality Date  . CERVICAL CERCLAGE    . TUBAL LIGATION      There were no vitals filed for this visit.  Subjective Assessment - 04/25/17 0936    Subjective  No pain. She reports getting around well. doing great.   She is doing all normal activity. 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      Currently in Pain?  No/denies         [facility] PT Assessment - 04/25/17 0001      AROM   Left Knee Extension  -6    Left Knee Flexion  120      PROM   Left Knee Extension  -3    Left Knee Flexion  125       MMT 5/5/ quads and ahmstrings           [facility] Adult PT Treatment/Exercise - 04/25/17 0001      Knee/Hip Exercises: Stretches   Knee: Self-Stretch to increase Flexion  Left;2 reps;30 seconds    Other Knee/Hip Stretches  sit heel to floor for extension th   wQS and overpressure       Knee/Hip Exercises: Aerobic   Recumbent Bike  L2 6 min      Knee/Hip Exercises: Standing   Heel Raises  20 reps    Forward Lunges  Left;15 reps    Side Lunges  Left;15 reps    Lateral Step Up  Hand Hold: 1;Step Height: 8";15  reps    Forward Step Up  Left;Step Height: 8";15 reps    Wall Squat  15 reps;10 seconds             PT Education - 04/25/17 1014    Education provided  Yes    Education Details  HEP    Person(s) Educated  Patient    Methods  Explanation;Demonstration;Verbal cues;Handout    Comprehension  Verbalized understanding;Returned demonstration       PT Short Term Goals - 04/10/17 1028      PT SHORT TERM GOAL #1   Title  She will be independent with inital HEP    Baseline  Pt reports compliance with current HEP 3-4 times per day, 7days a week     Time  4    Period  Weeks    Status  Achieved      PT SHORT TERM GOAL #2   Title  She will improve active flexion to 100 degrees,, extension to -20 degrees  Baseline  She measures 12-120 degrees left knee AROM today     Time  4    Period  Weeks    Status  Achieved      PT SHORT TERM GOAL #3   Title  She will begin to walk in home without device    Baseline  No longer using AD     Time  4    Period  Weeks    Status  Achieved        PT Long Term Goals - 04/25/17 1011      PT LONG TERM GOAL #1   Title  She will be independent with all HEP issued     Status  On-going      PT LONG TERM GOAL #2   Title  She will walk with no  device 100% time      Status  Achieved      PT LONG TERM GOAL #3   Title  She will be able to walk staris x15 step  step over step with one rail     Status  Achieved      PT LONG TERM GOAL #4   Title  She will report she has plan to return top work at [employer]   Title  She will report no pain with general walking and home tasks    Status  Achieved      PT LONG TERM GOAL #6   Title  She will be able to stand for 2 hours preping for return to work    Status  Achieved            Plan - 04/25/17 1009    Clinical Impression Statement  She is progressing withROM and strength and appears to need more strength and progression of ROM  to have equal ROM to  RT knee.  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   Manual for ROM .   stengthening exer  all per ACL protocol in EPIC      PT Home Exercise Plan  Flexion stretching 30-60 sec, DS with TKE stretching , SAQ and hip strength SLR supine and side, wall sit ,prone knee flexion, TKE , LAQ, leg press with band    Consulted and Agree with Plan of Care  Patient       Patient will benefit from skilled therapeutic intervention in order to improve the following deficits and impairments:  Pain, Impaired tone, Increased edema, Difficulty walking, Decreased activity tolerance, Decreased range of motion, Decreased strength  Visit Diagnosis: Localized edema  Stiffness of left knee, not elsewhere classified  Left knee pain, unspecified chronicity  Difficulty in walking, not elsewhere classified  Muscle weakness (generalized)     Problem List There are no active problems to display for this patient.   Darrel Hoover  PT 04/25/2017, 10:15 AM  [facility] [address] [place], [place], [address] Phone: [phone]   Fax:  [phone]  Name: Nicole [name] MRN: [id number] Date of Birth: Aug 25, 1980

## 2017-05-08 ENCOUNTER — Ambulatory Visit: Payer: Medicaid Other

## 2017-05-08 DIAGNOSIS — M6281 Muscle weakness (generalized): Secondary | ICD-10-CM

## 2017-05-08 DIAGNOSIS — R600 Localized edema: Secondary | ICD-10-CM

## 2017-05-08 DIAGNOSIS — R262 Difficulty in walking, not elsewhere classified: Secondary | ICD-10-CM

## 2017-05-08 DIAGNOSIS — M25562 Pain in left knee: Secondary | ICD-10-CM

## 2017-05-08 DIAGNOSIS — M25662 Stiffness of left knee, not elsewhere classified: Secondary | ICD-10-CM

## 2017-05-08 NOTE — Therapy (Signed)
[place], [place], [address] Phone: [phone]   Fax:  [phone]  Physical Therapy Treatment  Patient Details  Name: Nicole [name] MRN: [id number] Date of Birth: 10/13/1980 Referring Provider: Hoy Register MD   Encounter Date: 05/08/2017  PT End of Session - 05/08/17 0941    Visit Number  5    Number of Visits  12    Date for PT Re-Evaluation  05/26/17    Authorization Type  Medicaid    Authorization Time Period  1/9 to 05/09/17    Authorization - Visit Number  2    Authorization - Number of Visits  3    PT Start Time  0930    PT Stop Time  1010    PT Time Calculation (min)  40 min    Activity Tolerance  Patient tolerated treatment well;No increased pain    Behavior During Therapy  [facility] for tasks assessed/performed       History reviewed. No pertinent past medical history.  Past Surgical History:  Procedure Laterality Date  . CERVICAL CERCLAGE    . TUBAL LIGATION      There were no vitals filed for this visit.  Subjective Assessment - 05/08/17 0941    Subjective  No pain and starts work today    Currently in Pain?  No/denies                      [facility] Adult PT Treatment/Exercise - 05/08/17 0001      Neuro Re-ed    Neuro Re-ed Details    balance on boars and on foam  with RT leg swings  x 15 no UE support       Knee/Hip Exercises: Aerobic   Recumbent Bike  L2 6 min      Knee/Hip Exercises: Standing   Lateral Step Up  Step Height: 8";15 reps;Limitations    Lateral Step Up Limitations  also 15 at 11 inches    Wall Squat  15 reps;10 seconds               PT Short Term Goals - 05/08/17 0949      PT SHORT TERM GOAL #1   Title  She will be independent with inital HEP    Baseline  Pt reports compliance with current HEP 3-4 times per day, 7days a week     Status  Achieved      PT SHORT TERM GOAL #2   Title  She will improve active flexion to 100 degrees,,  extension to -20 degrees    Baseline  She measures 12-120 degrees left knee AROM today     Status  Achieved      PT SHORT TERM GOAL #3   Title  She will begin to walk in home without device    Baseline  No longer using AD     Status  Achieved        PT Long Term Goals - 05/08/17 0949      PT LONG TERM GOAL #1   Title  She will be independent with all HEP issued     Baseline  Performing current HEP 3-4 times oer day but RTW today may limit frequency    Time  10    Period  Weeks    Status  On-going      PT LONG TERM GOAL #2   Title  She will walk with no  device 100%  time      Status  Achieved      PT LONG TERM GOAL #3   Title  She will be able to walk staris x15 step  step over step with one rail     Baseline  no rails, able to ascend and descend 16 resiprocal stairs    Status  Achieved      PT LONG TERM GOAL #4   Title  She will report she has plan to return top work at [employer]    Baseline  today    Status  Achieved      PT [place] #5   Title  She will report no pain with general walking and home tasks    Baseline  Reports no pain with general walking and home tasks     Status  Achieved      PT LONG TERM GOAL #6   Title  She will be able to stand for 2 hours preping for return to work    Baseline  She stood for Holiday prep for 2 hours at home without increased pain.     Status  Achieved      PT LONG TERM GOAL #7   Title  She will be able to jog/ cut and jump per 12-16 week protocl allowances    Baseline  Not allowed to run cut or jump per protocol    Time  10    Period  Weeks    Status  New            Plan - 05/08/17 0943    Clinical Impression Statement  REquest extension though she is doing very well bhas not been allowed to run/cut /jump as she is only 10-11 weeks out from surgery     PT Frequency  1x / week    PT Duration  4 weeks    PT Treatment/Interventions  Cryotherapy;Vasopneumatic Device;Lobbyist;Therapeutic  exercise;Passive range of motion;Gait training;Stair training;Manual techniques;Taping    PT Next Visit Plan   progress in to the 12-16 week of protocol .   .   stengthening exer  all per ACL protocol in EPIC      PT Home Exercise Plan  Flexion stretching 30-60 sec, DS with TKE stretching , SAQ and hip strength SLR supine and side, wall sit ,prone knee flexion, TKE , LAQ, leg press with band    Consulted and Agree with Plan of Care  Patient       Patient will benefit from skilled therapeutic intervention in order to improve the following deficits and impairments:  Pain, Impaired tone, Increased edema, Difficulty walking, Decreased activity tolerance, Decreased range of motion, Decreased strength  Visit Diagnosis: Localized edema  Stiffness of left knee, not elsewhere classified  Left knee pain, unspecified chronicity  Difficulty in walking, not elsewhere classified  Muscle weakness (generalized)     Problem List There are no active problems to display for this patient.   Darrel Hoover  PT 05/08/2017, 10:05 AM  [facility] [address] [place], [place], [address] Phone: [phone]   Fax:  [phone]  Name: Nicole [name] MRN: [id number] Date of Birth: 1981-03-01

## 2017-05-23 ENCOUNTER — Encounter: Payer: Self-pay | Admitting: Physical Therapy

## 2017-05-23 ENCOUNTER — Ambulatory Visit: Payer: Medicaid Other | Attending: Orthopedic Surgery | Admitting: Physical Therapy

## 2017-05-23 ENCOUNTER — Ambulatory Visit: Payer: Medicaid Other

## 2017-05-23 DIAGNOSIS — R262 Difficulty in walking, not elsewhere classified: Secondary | ICD-10-CM | POA: Insufficient documentation

## 2017-05-23 DIAGNOSIS — M6281 Muscle weakness (generalized): Secondary | ICD-10-CM | POA: Insufficient documentation

## 2017-05-23 DIAGNOSIS — M25562 Pain in left knee: Secondary | ICD-10-CM | POA: Insufficient documentation

## 2017-05-23 DIAGNOSIS — R600 Localized edema: Secondary | ICD-10-CM | POA: Diagnosis not present

## 2017-05-23 DIAGNOSIS — M25662 Stiffness of left knee, not elsewhere classified: Secondary | ICD-10-CM | POA: Diagnosis present

## 2017-05-23 NOTE — Patient Instructions (Addendum)
   Cone Jump: Forward / Backward    Stand behind  Mini cone or a barrier.  Push off with both feet to jump forward over cone. Land and immediately with soft knees Repeat _20__ times.  http://plyo.exer.us/100   Copyright  VHI. All rights reserved.     Voncille Lo, PT Certified Exercise Expert for the Aging Adult  05/23/17 10:41 AM Phone: [phone] Fax: [phone]

## 2017-05-23 NOTE — Therapy (Signed)
[place], [place], [address] Phone: [phone]   Fax:  [phone]  Physical Therapy Treatment  Patient Details  Name: Nicole [name] MRN: [id number] Date of Birth: [birth year] Referring Provider: Hoy Register MD   Encounter Date: 05/23/2017  Nicole [name] End of Session - 05/23/17 1016    Visit Number  6    Number of Visits  12    Date for Nicole [name] Re-Evaluation  05/26/17    Authorization Type  Medicaid    Authorization Time Period  1/9 to 05/09/17    Nicole [name] Start Time  1015    Nicole [name] Stop Time  1100    Nicole [name] Time Calculation (min)  45 min    Activity Tolerance  Patient tolerated treatment well;No increased pain    Behavior During Therapy  [facility] for tasks assessed/performed       History reviewed. No pertinent past medical history.  Past Surgical History:  Procedure Laterality Date  . CERVICAL CERCLAGE    . TUBAL LIGATION      There were no vitals filed for this visit.  Subjective Assessment - 05/23/17 1018    Subjective  I am doing really good. I started work and I am on the computer mostly    How long can you sit comfortably?  unliited    How long can you stand comfortably?  90 minutes    How long can you walk comfortably?  goes grocery shopping  for 90 minutes    Patient Stated Goals  She wants to be able to walk without limp and return to work     Currently in Pain?  No/denies                      [facility] Adult Nicole [name] Treatment/Exercise - 05/23/17 1020      Neuro Re-ed    Neuro Re-ed Details   proprioception SLS clock on Ther ex pad and balance board      Knee/Hip Exercises: Aerobic   Recumbent Bike  L4 32min      Knee/Hip Exercises: Plyometrics   Bilateral Jumping  2 sets    Other Plyometric Exercises  4 inch step landing bil hop with soft knees 10 x 4 in lateral hops off of step with soft landing 20 x ( knee burning at end)      Knee/Hip Exercises: Standing   Lateral Step Up  Step Height: 8";15  reps;Limitations    Wall Squat  10 seconds;5 reps;3 sets challenging    Rebounder  yellow medicine ball  SLS for 9 x and 8 x and 6 x consecutively    Other Standing Knee Exercises  SLS sit to stand 10 x 2    Other Standing Knee Exercises  single limb stance blue medicine ball stance toss 15 x 2 on ther ex pad  single Limb stance clocks for 3 minutes             Nicole [name] Education - 05/23/17 1041    Education provided  Yes    Education Details  added proprioception exercises to [employer]) Educated  Patient    Methods  Explanation;Demonstration;Tactile cues;Verbal cues;Handout    Comprehension  Verbalized understanding;Returned demonstration       Nicole [name] Short Term Goals - 05/08/17 0949      Nicole [name] SHORT TERM GOAL #1   Title  She will be independent with inital HEP    Baseline  Nicole [name] reports compliance with  current HEP 3-4 times per day, 7days a week     Status  Achieved      Nicole [name] SHORT TERM GOAL #2   Title  She will improve active flexion to 100 degrees,, extension to -20 degrees    Baseline  She measures 12-120 degrees left knee AROM today     Status  Achieved      Nicole [name] SHORT TERM GOAL #3   Title  She will begin to walk in home without device    Baseline  No longer using AD     Status  Achieved        Nicole [name] Long Term Goals - 05/08/17 0949      Nicole [name] LONG TERM GOAL #1   Title  She will be independent with all HEP issued     Baseline  Performing current HEP 3-4 times oer day but RTW today may limit frequency    Time  10    Period  Weeks    Status  On-going      Nicole [name] LONG TERM GOAL #2   Title  She will walk with no  device 100% time      Status  Achieved      Nicole [name] LONG TERM GOAL #3   Title  She will be able to walk staris x15 step  step over step with one rail     Baseline  no rails, able to ascend and descend 16 resiprocal stairs    Status  Achieved      Nicole [name] LONG TERM GOAL #4   Title  She will report she has plan to return top work at [employer]    Baseline  today    Status  Achieved       Nicole [name] [place] #5   Title  She will report no pain with general walking and home tasks    Baseline  Reports no pain with general walking and home tasks     Status  Achieved      Nicole [name] LONG TERM GOAL #6   Title  She will be able to stand for 2 hours preping for return to work    Baseline  She stood for Holiday prep for 2 hours at home without increased pain.     Status  Achieved      Nicole [name] LONG TERM GOAL #7   Title  She will be able to jog/ cut and jump per 12-16 week protocl allowances    Baseline  Not allowed to run cut or jump per protocol    Time  10    Period  Weeks    Status  New            Plan - 05/23/17 1051    Clinical Impression Statement  Nicole [name] began double hop jumps off 4 inch block with soft landing. and added Proprioceptive exercises to home program.  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  No pain initially but Nicole [name] definitely fatigued at end of session.  Nicole [name] worked hard and was fatigued at end of session    History and Personal Factors relevant to plan of care:  ACL tear and meniscal tear    Rehab Potential  Good    Nicole [name] Frequency  1x / week    Nicole [name] Duration  4 weeks    Nicole [name] Treatment/Interventions  Cryotherapy;Vasopneumatic Device;Lobbyist;Therapeutic exercise;Passive range of motion;Gait training;Stair training;Manual techniques;Taping    Nicole [name] Next Visit Plan  Nicole [name] started 12-16  week protocol.  begain bil jumping last visit/ soft landing.      Nicole [name] Home Exercise Plan  Flexion stretching 30-60 sec, DS with TKE stretching , SAQ and hip strength SLR supine and side, wall sit ,prone knee flexion, TKE , LAQ, leg press with band, proprioception exercises and jumping bil off 4 inch step    Consulted and Agree with Plan of Care  Patient       Patient will benefit from skilled therapeutic intervention in order to improve the following deficits and impairments:  Pain, Impaired tone, Increased edema, Difficulty walking, Decreased activity  tolerance, Decreased range of motion, Decreased strength  Visit Diagnosis: Localized edema  Stiffness of left knee, not elsewhere classified  Left knee pain, unspecified chronicity  Difficulty in walking, not elsewhere classified  Muscle weakness (generalized)     Problem List There are no active problems to display for this patient.   Nicole [name], Nicole [name] Certified Exercise Expert for the Aging Adult  05/23/17 11:03 AM Phone: [phone] Fax: [place] [facility] [address] [place], [place], [address] Phone: [phone]   Fax:  [phone]  Name: RYENN HOWETH MRN: [id number] Date of Birth: [birth year]

## 2017-05-30 ENCOUNTER — Ambulatory Visit: Payer: Medicaid Other

## 2017-05-30 DIAGNOSIS — M6281 Muscle weakness (generalized): Secondary | ICD-10-CM

## 2017-05-30 DIAGNOSIS — R600 Localized edema: Secondary | ICD-10-CM | POA: Diagnosis not present

## 2017-05-30 DIAGNOSIS — M25562 Pain in left knee: Secondary | ICD-10-CM

## 2017-05-30 DIAGNOSIS — R262 Difficulty in walking, not elsewhere classified: Secondary | ICD-10-CM

## 2017-05-30 DIAGNOSIS — M25662 Stiffness of left knee, not elsewhere classified: Secondary | ICD-10-CM

## 2017-05-30 NOTE — Therapy (Signed)
[place], [place], [address] Phone: [phone]   Fax:  [phone]  Physical Therapy Treatment  Patient Details  Name: Nicole [name] MRN: [id number] Date of Birth: 1980-07-27 Referring Provider: Hoy Register MD   Encounter Date: 05/30/2017  PT End of Session - 05/30/17 0927    Visit Number  7    Number of Visits  12    Date for PT Re-Evaluation  05/26/17    Authorization Type  Medicaid    Authorization Time Period  2/5/ to 06/12/17    Authorization - Visit Number  2    Authorization - Number of Visits  4    PT Start Time  0925    PT Stop Time  1010    PT Time Calculation (min)  45 min    Activity Tolerance  Patient tolerated treatment well;No increased pain    Behavior During Therapy  [facility] for tasks assessed/performed       No past medical history on file.  Past Surgical History:  Procedure Laterality Date  . CERVICAL CERCLAGE    . TUBAL LIGATION      There were no vitals filed for this visit.  Subjective Assessment - 05/30/17 0927    Subjective  Doing well , work is good.     Currently in Pain?  No/denies                      [facility] Adult PT Treatment/Exercise - 05/30/17 0001      Therapeutic Activites    Therapeutic Activities  Lifting    Lifting  Before starting liftting HR was 142  BP 140/80   2 min rest   HR down to 120  the as high as 155 with final 70 pound lift         Knee/Hip Exercises: Aerobic   Recumbent Bike  L4 48min      Knee/Hip Exercises: Plyometrics   Other Plyometric Exercises  4 inch step landing bil hop with soft knees 10 x 4 in lateral hops off of step with soft landing 20 x ( knee burning at end)      Knee/Hip Exercises: Standing   Lateral Step Up  Left;Hand Hold: 0;20 reps;Step Height: 8"    Lateral Step Up Limitations  also 15 at 11 inches    Wall Squat  15 reps;1 set;5 seconds               PT Short Term Goals - 05/08/17 0949      PT SHORT TERM GOAL #1   Title  She will be independent with inital HEP    Baseline  Pt reports compliance with current HEP 3-4 times per day, 7days a week     Status  Achieved      PT SHORT TERM GOAL #2   Title  She will improve active flexion to 100 degrees,, extension to -20 degrees    Baseline  She measures 12-120 degrees left knee AROM today     Status  Achieved      PT SHORT TERM GOAL #3   Title  She will begin to walk in home without device    Baseline  No longer using AD     Status  Achieved        PT Long Term Goals - 05/30/17 1001      PT LONG TERM GOAL #1   Title  She will be independent with  all HEP issued     Status  On-going      PT LONG TERM GOAL #2   Title  She will walk with no  device 100% time      Status  Achieved      PT LONG TERM GOAL #3   Title  She will be able to walk staris x15 step  step over step with one rail     Status  Achieved      PT LONG TERM GOAL #4   Title  She will report she has plan to return top work at [employer] #5   Title  She will report no pain with general walking and home tasks    Baseline  Reports no pain with general walking and home tasks     Status  Achieved      PT LONG TERM GOAL #6   Title  She will be able to stand for 2 hours preping for return to work    Status  Achieved      PT [place] #7   Title  She will be able to jog/ cut and jump per 12-16 week protocl allowances    Baseline  progressing     Status  On-going            Plan - 05/30/17 0933    Clinical Impression Statement  Tolerating more vigorous activity without pain . Liftinh she was able to lift 60 pounds with good tech and 70 pounds with visibly more effort so max is 70 pounds and more tolerable weight 60 pounds  . No pain with lifting . She will be ready for discharge in 2 weeks. Debby Bud    PT Treatment/Interventions  Cryotherapy;Vasopneumatic Device;Camera operator;Therapeutic exercise;Passive range of motion;Gait training;Stair training;Manual techniques;Taping    PT Next Visit Plan  Pt started 12-16 week protocol.  begain bil jumping last visit/ soft landing.      PT Home Exercise Plan  Flexion stretching 30-60 sec, DS with TKE stretching , SAQ and hip strength SLR supine and side, wall sit ,prone knee flexion, TKE , LAQ, leg press with band, proprioception exercises and jumping bil off 4 inch step    Consulted and Agree with Plan of Care  Patient       Patient will benefit from skilled therapeutic intervention in order to improve the following deficits and impairments:  Pain, Impaired tone, Increased edema, Difficulty walking, Decreased activity tolerance, Decreased range of motion, Decreased strength  Visit Diagnosis: Localized edema  Stiffness of left knee, not elsewhere classified  Left knee pain, unspecified chronicity  Difficulty in walking, not elsewhere classified  Muscle weakness (generalized)     Problem List There are no active problems to display for this patient.   Darrel Hoover  PT 05/30/2017, 10:03 AM  [facility] [address] [place], [place], [address] Phone: [phone]   Fax:  [phone]  Name: Nicole [name] MRN: [id number] Date of Birth: Aug 15, 1980

## 2017-06-06 ENCOUNTER — Ambulatory Visit: Payer: Medicaid Other

## 2017-06-06 DIAGNOSIS — R262 Difficulty in walking, not elsewhere classified: Secondary | ICD-10-CM

## 2017-06-06 DIAGNOSIS — M25562 Pain in left knee: Secondary | ICD-10-CM

## 2017-06-06 DIAGNOSIS — R600 Localized edema: Secondary | ICD-10-CM

## 2017-06-06 DIAGNOSIS — M6281 Muscle weakness (generalized): Secondary | ICD-10-CM

## 2017-06-06 DIAGNOSIS — M25662 Stiffness of left knee, not elsewhere classified: Secondary | ICD-10-CM

## 2017-06-06 NOTE — Therapy (Signed)
[place], [place], [address] Phone: [phone]   Fax:  [phone]  Physical Therapy Treatment/ Discharge  Patient Details  Name: Nicole [name] MRN: [id number] Date of Birth: 06-15-1980 Referring Provider: Hoy Register MD   Encounter Date: 06/06/2017  PT End of Session - 06/06/17 0928    Visit Number  8    Number of Visits  12    Date for PT Re-Evaluation  06/12/17    Authorization Type  Medicaid    Authorization Time Period  2/5/ to 06/12/17    Authorization - Visit Number  3    Authorization - Number of Visits  4    PT Start Time  5462    PT Stop Time  1010    PT Time Calculation (min)  45 min    Activity Tolerance  Patient tolerated treatment well;No increased pain    Behavior During Therapy  [facility] for tasks assessed/performed       No past medical history on file.  Past Surgical History:  Procedure Laterality Date  . CERVICAL CERCLAGE    . TUBAL LIGATION      There were no vitals filed for this visit.  Subjective Assessment - 06/06/17 0936    Subjective  Doing well , work is good. MD pleased    Currently in Pain?  No/denies                      [facility] Adult PT Treatment/Exercise - 06/06/17 0001      Knee/Hip Exercises: Standing   Lateral Step Up  Left;Hand Hold: 1;20 reps 11 inch height             PT Education - 06/06/17 1013    Education provided  Yes    Education Details  step ups 2-3 steps at home ,  balancing on one leg with toudh to floor, progression in a month to increase intensity of activity with son    Person(s) Educated  Patient    Methods  Explanation;Demonstration;Tactile cues;Verbal cues    Comprehension  Returned demonstration;Verbalized understanding       PT Short Term Goals - 06/06/17 7035      PT SHORT TERM GOAL #1   Title  She will be independent with inital HEP    Status  Achieved      PT SHORT TERM GOAL #2   Title  She will  improve active flexion to 100 degrees,, extension to -20 degrees    Status  Achieved      PT SHORT TERM GOAL #3   Title  She will begin to walk in home without device    Status  Achieved        PT Long Term Goals - 06/06/17 0943      PT LONG TERM GOAL #1   Title  She will be independent with all HEP issued     Status  Achieved      PT LONG TERM GOAL #2   Title  She will walk with no  device 100% time      Status  Achieved      PT LONG TERM GOAL #3   Title  She will be able to walk staris x15 step  step over step with one rail     Status  Achieved      PT LONG TERM GOAL #4   Title  She will report she has plan to return  top work at [employer] #5   Title  She will report no pain with general walking and home tasks    Status  Achieved      PT LONG TERM GOAL #6   Title  She will be able to stand for 2 hours preping for return to work    Status  Achieved      PT [place] #7   Title  She will be able to jog/ cut and jump per 12-16 week protocl allowances    Status  Achieved            Plan - 06/06/17 0937    Clinical Impression Statement  Ms Klee has 1 more visit but she does not need more exercises and she is doing a good program with her son with dynamic activity  with running and cutting and she appears to be doing this at an appropriate level.  I asked her to wait another month to up her intensity with running etc. She is able to step up at 18 inches with 1 UE asssit x 20 reps .  she was able to lift 70 pounds without pain with squat mechanics that are good. She has no pain with these activity.   she is ready for discharge as she is functional with all activityat home , Understands need for soft knees with activity and progression of higher level activity.   She has no pain    PT Treatment/Interventions  Cryotherapy;Vasopneumatic Device;Lobbyist;Therapeutic exercise;Passive range of motion;Gait  training;Stair training;Manual techniques;Taping    PT Next Visit Plan  discharge today with HEP    PT Home Exercise Plan  Flexion stretching 30-60 sec, DS with TKE stretching , SAQ and hip strength SLR supine and side, wall sit ,prone knee flexion, TKE , LAQ, leg press with band, proprioception exercises and jumping bil off 4 inch step    Consulted and Agree with Plan of Care  Patient       Patient will benefit from skilled therapeutic intervention in order to improve the following deficits and impairments:  Pain, Impaired tone, Increased edema, Difficulty walking, Decreased activity tolerance, Decreased range of motion, Decreased strength  Visit Diagnosis: Localized edema  Stiffness of left knee, not elsewhere classified  Left knee pain, unspecified chronicity  Difficulty in walking, not elsewhere classified  Muscle weakness (generalized)     Problem List There are no active problems to display for this patient.   Darrel Hoover  PT 06/06/2017, 10:15 AM  [facility] [address] [place], [place], [address] Phone: [phone]   Fax:  [phone]  Name: Nicole [name] MRN: [id number] Date of Birth: [birth year]  PHYSICAL THERAPY DISCHARGE SUMMARY  Visits from Start of Care:   Current functional level related to goals / functional outcomes: See above   Remaining deficits: None except not running full speed   Education / Equipment: HEP  Plan: Patient agrees to discharge.  Patient goals were met. Patient is being discharged due to meeting the stated rehab goals.  ?????

## 2017-06-07 ENCOUNTER — Ambulatory Visit: Payer: Medicaid Other | Admitting: Physical Therapy

## 2018-02-24 ENCOUNTER — Encounter (HOSPITAL_COMMUNITY): Payer: Self-pay

## 2018-02-24 ENCOUNTER — Ambulatory Visit (HOSPITAL_COMMUNITY)
Admission: EM | Admit: 2018-02-24 | Discharge: 2018-02-24 | Disposition: A | Discharge status: Home / Self Care | Payer: Medicaid Other | Attending: Family Medicine | Admitting: Family Medicine

## 2018-02-24 DIAGNOSIS — S40862A Insect bite (nonvenomous) of left upper arm, initial encounter: Secondary | ICD-10-CM

## 2018-02-24 DIAGNOSIS — M779 Enthesopathy, unspecified: Secondary | ICD-10-CM

## 2018-02-24 DIAGNOSIS — W57XXXA Bitten or stung by nonvenomous insect and other nonvenomous arthropods, initial encounter: Secondary | ICD-10-CM

## 2018-02-24 MED ORDER — PREDNISONE 10 MG (21) PO TBPK: ORAL_TABLET | ORAL | 0 refills | Status: DC

## 2018-02-24 NOTE — Discharge Instructions (Addendum)
We will try some steroids to see if this helps with the inflammation in your arm, numbness and tingling.  Follow up as needed for continued or worsening symptoms

## 2018-02-24 NOTE — ED Triage Notes (Signed)
Pt presents with complaints of bug bite to left shoulder 1 month ago. Complains of continued swelling and nausea since.

## 2018-02-26 NOTE — ED Provider Notes (Signed)
[place]    CSN: [id number] Arrival date & time: 02/24/18  1656     History   Chief Complaint Chief Complaint  Patient presents with  . Insect Bite    HPI Nicole [name] is a 37 y.o. female.    Allergic Reaction  Presenting symptoms: swelling   Presenting symptoms: no difficulty breathing, no difficulty swallowing, no itching, no rash and no wheezing   Presenting symptoms comment:  She is also having some numbness and tingling in the left 4th and 5th phalanges. Reports that this is most likely due to her tendonitis she get. She does wear a wrist plint at times. She does repetitive movements with wrists and hands.  Severity:  Mild Duration:  4 weeks Prior allergic episodes:  Insect allergies Context: insect bite/sting   Context: not animal exposure, not chemicals, not cosmetics, not dairy/milk products, not eggs, not food allergies, not grass, not jewelry/metal, not medications, not new detergents/soaps, not nuts and not poison ivy   Relieved by:  Nothing Worsened by:  Nothing Ineffective treatments:  None tried   History reviewed. No pertinent past medical history.  There are no active problems to display for this patient.   Past Surgical History:  Procedure Laterality Date  . CERVICAL CERCLAGE    . TUBAL LIGATION      OB History   None      Home Medications    Prior to Admission medications   Medication Sig Start Date End Date Taking? Authorizing Provider  fluconazole (DIFLUCAN) 150 MG tablet Take 1 tablet (150 mg total) by mouth every 3 (three) days. Patient not taking: Reported on 03/16/2017 10/18/16   [name], MD  predniSONE (STERAPRED UNI-PAK 21 TAB) 10 MG (21) TBPK tablet 6 tabs for 1 day, then 5 tabs for 1 das, then 4 tabs for 1 day, then 3 tabs for 1 day, 2 tabs for 1 day, then 1 tab for 1 day 02/24/18   Orvan July, NP    Family History Family History  Problem Relation Age of Onset  . Healthy Mother   . Healthy Father      Social History Social History   Tobacco Use  . Smoking status: Current Every Day Smoker  . Smokeless tobacco: Never Used  Substance Use Topics  . Alcohol use: Yes    Comment: 2-3 times per week, 2 drinks per time  . Drug use: Yes    Types: Marijuana     Allergies   Patient has no known allergies.   Review of Systems Review of Systems  HENT: Negative for trouble swallowing.   Respiratory: Negative for wheezing.   Skin: Negative for itching and rash.     Physical Exam Triage Vital Signs ED Triage Vitals [02/24/18 1718]  Enc Vitals Group     BP (!) 151/96     Pulse Rate (!) 104     Resp 18     Temp 98 F (36.7 C)     Temp src      SpO2 100 %     Weight      Height      Head Circumference      Peak Flow      Pain Score 5     Pain Loc      Pain Edu?      Excl. in [place]?    No data found.  Updated Vital Signs BP (!) 151/96   Pulse (!) 104   Temp  98 F (36.7 C)   Resp 18   LMP 02/24/2018   SpO2 100%   Visual Acuity Right Eye Distance:   Left Eye Distance:   Bilateral Distance:    Right Eye Near:   Left Eye Near:    Bilateral Near:     Physical Exam  Constitutional: She appears well-developed and well-nourished.  HENT:  Head: Normocephalic and atraumatic.  Eyes: Conjunctivae are normal.  Neck: Normal range of motion.  Pulmonary/Chest: Effort normal.  Musculoskeletal: Normal range of motion.  Mild decreased sensation to the left 4th and 5th phalanges. Negative tinel, finkelstein test. No obvious swelling, erythema to the wrist.  Mild tenderness to medial aspect of the wrist.   Neurological: She is alert.  Skin: Skin is warm and dry.  Moderate localized swelling to the left upper arm. Tender to touch. No erythema or warmth.   Psychiatric: She has a normal mood and affect.  Nursing note and vitals reviewed.    UC Treatments / Results  Labs (all labs ordered are listed, but only abnormal results are displayed) Labs Reviewed - No data to  display  EKG None  Radiology No results found.  Procedures Procedures (including critical care time)  Medications Ordered in UC Medications - No data to display  Initial Impression / Assessment and Plan / UC Course  I have reviewed the triage vital signs and the nursing notes.  Pertinent labs & imaging results that were available during my care of the patient were reviewed by me and considered in my medical decision making (see chart for details).    Mostly likely tendonitis related symptoms. Prednisone taper to treat swelling, numbness, tingling.  No signs of infection Follow up as needed for continued or worsening symptoms   Final Clinical Impressions(s) / UC Diagnoses   Final diagnoses:  Tendonitis  Insect bite of left upper arm, initial encounter     Discharge Instructions     We will try some steroids to see if this helps with the inflammation in your arm, numbness and tingling.  Follow up as needed for continued or worsening symptoms      ED Prescriptions    Medication Sig Dispense Auth. Provider   predniSONE (STERAPRED UNI-PAK 21 TAB) 10 MG (21) TBPK tablet 6 tabs for 1 day, then 5 tabs for 1 das, then 4 tabs for 1 day, then 3 tabs for 1 day, 2 tabs for 1 day, then 1 tab for 1 day 21 tablet Rozanna Box, Danell Vazquez A, NP     Controlled Substance Prescriptions [place] Controlled Substance Registry consulted? Not Applicable   Orvan July, NP 02/26/18 [phone]

## 2018-05-29 ENCOUNTER — Other Ambulatory Visit: Payer: Self-pay | Admitting: Nurse Practitioner

## 2018-05-29 DIAGNOSIS — Z1231 Encounter for screening mammogram for malignant neoplasm of breast: Secondary | ICD-10-CM

## 2018-06-28 ENCOUNTER — Inpatient Hospital Stay: Admission: RE | Admit: 2018-06-28 | Payer: Medicaid Other | Source: Ambulatory Visit

## 2018-08-14 ENCOUNTER — Other Ambulatory Visit (HOSPITAL_BASED_OUTPATIENT_CLINIC_OR_DEPARTMENT_OTHER): Payer: Self-pay

## 2018-08-14 DIAGNOSIS — G4733 Obstructive sleep apnea (adult) (pediatric): Secondary | ICD-10-CM

## 2018-09-10 ENCOUNTER — Ambulatory Visit: Payer: Medicaid Other

## 2018-09-21 ENCOUNTER — Other Ambulatory Visit (HOSPITAL_COMMUNITY): Payer: Medicaid Other

## 2018-09-25 ENCOUNTER — Encounter (HOSPITAL_BASED_OUTPATIENT_CLINIC_OR_DEPARTMENT_OTHER): Payer: Medicaid Other

## 2018-10-18 ENCOUNTER — Other Ambulatory Visit (HOSPITAL_COMMUNITY): Payer: Medicaid Other

## 2018-10-21 ENCOUNTER — Encounter (HOSPITAL_BASED_OUTPATIENT_CLINIC_OR_DEPARTMENT_OTHER): Payer: Medicaid Other

## 2018-10-26 ENCOUNTER — Ambulatory Visit
Admission: RE | Admit: 2018-10-26 | Discharge: 2018-10-26 | Disposition: A | Discharge status: Home / Self Care | Payer: Medicaid Other | Source: Ambulatory Visit | Attending: Family Medicine | Admitting: Family Medicine

## 2018-10-26 ENCOUNTER — Other Ambulatory Visit: Payer: Self-pay

## 2018-10-26 DIAGNOSIS — Z1231 Encounter for screening mammogram for malignant neoplasm of breast: Secondary | ICD-10-CM

## 2018-10-29 ENCOUNTER — Other Ambulatory Visit: Payer: Self-pay | Admitting: Family Medicine

## 2018-10-29 DIAGNOSIS — R928 Other abnormal and inconclusive findings on diagnostic imaging of breast: Secondary | ICD-10-CM

## 2018-10-31 ENCOUNTER — Other Ambulatory Visit: Payer: Self-pay | Admitting: Family Medicine

## 2018-10-31 ENCOUNTER — Other Ambulatory Visit: Payer: Self-pay | Admitting: Nurse Practitioner

## 2018-10-31 DIAGNOSIS — R928 Other abnormal and inconclusive findings on diagnostic imaging of breast: Secondary | ICD-10-CM

## 2018-11-02 ENCOUNTER — Ambulatory Visit
Admission: RE | Admit: 2018-11-02 | Discharge: 2018-11-02 | Disposition: A | Discharge status: Home / Self Care | Payer: Medicaid Other | Source: Ambulatory Visit | Attending: Family Medicine | Admitting: Family Medicine

## 2018-11-02 ENCOUNTER — Other Ambulatory Visit: Payer: Self-pay

## 2018-11-02 ENCOUNTER — Other Ambulatory Visit: Payer: Self-pay | Admitting: Nurse Practitioner

## 2018-11-02 DIAGNOSIS — R928 Other abnormal and inconclusive findings on diagnostic imaging of breast: Secondary | ICD-10-CM

## 2020-01-17 ENCOUNTER — Other Ambulatory Visit: Payer: Self-pay

## 2020-01-17 ENCOUNTER — Ambulatory Visit (HOSPITAL_COMMUNITY)
Admission: EM | Admit: 2020-01-17 | Discharge: 2020-01-17 | Disposition: A | Discharge status: Home / Self Care | Payer: Medicaid Other | Attending: Family Medicine | Admitting: Family Medicine

## 2020-01-17 ENCOUNTER — Encounter (HOSPITAL_COMMUNITY): Payer: Self-pay

## 2020-01-17 DIAGNOSIS — N898 Other specified noninflammatory disorders of vagina: Secondary | ICD-10-CM | POA: Insufficient documentation

## 2020-01-17 DIAGNOSIS — N3001 Acute cystitis with hematuria: Secondary | ICD-10-CM | POA: Diagnosis not present

## 2020-01-17 DIAGNOSIS — R102 Pelvic and perineal pain: Secondary | ICD-10-CM

## 2020-01-17 LAB — POCT URINALYSIS DIPSTICK, ED / UC
Bilirubin Urine: NEGATIVE
Glucose, UA: NEGATIVE mg/dL
Ketones, ur: NEGATIVE mg/dL
Nitrite: NEGATIVE
Protein, ur: NEGATIVE mg/dL
Specific Gravity, Urine: 1.025 (ref 1.005–1.030)
Urobilinogen, UA: 0.2 mg/dL (ref 0.0–1.0)
pH: 5.5 (ref 5.0–8.0)

## 2020-01-17 MED ORDER — SULFAMETHOXAZOLE-TRIMETHOPRIM 800-160 MG PO TABS: 1.0000 | ORAL_TABLET | Freq: Two times a day (BID) | ORAL | 0 refills | Status: AC

## 2020-01-17 NOTE — ED Triage Notes (Signed)
Pt present vaginal pain with some discharge. Symptoms started 3 wks after having sex and just has not gotten better. Pt discharge is clear, she did have some itching but it stopped.

## 2020-01-17 NOTE — ED Provider Notes (Signed)
[facility]    CSN: [id number] Arrival date & time: 01/17/20  1057      History   Chief Complaint Chief Complaint  Patient presents with  . Vaginal Pain    HPI Nicole [name] is a 39 y.o. female.   Here today with about 3 weeks of clear vaginal discharge and pins and needles pain off and on that she describes to be in her "cervical area". This has all been since having intercourse prior to onset of sxs. Denies abdominal pain, flank pain, fever, chills, N/V/D. Not trying anything OTC for sxs. No known exposure to STIs.     History reviewed. No pertinent past medical history.  There are no problems to display for this patient.   Past Surgical History:  Procedure Laterality Date  . CERVICAL CERCLAGE    . TUBAL LIGATION      OB History   No obstetric history on file.      Home Medications    Prior to Admission medications   Medication Sig Start Date End Date Taking? Authorizing Provider  fluconazole (DIFLUCAN) 150 MG tablet Take 1 tablet (150 mg total) by mouth every 3 (three) days. Patient not taking: Reported on 03/16/2017 10/18/16   [name], MD  predniSONE (STERAPRED UNI-PAK 21 TAB) 10 MG (21) TBPK tablet 6 tabs for 1 day, then 5 tabs for 1 das, then 4 tabs for 1 day, then 3 tabs for 1 day, 2 tabs for 1 day, then 1 tab for 1 day 02/24/18   Loura Halt A, NP  sulfamethoxazole-trimethoprim (BACTRIM DS) 800-160 MG tablet Take 1 tablet by mouth 2 (two) times daily for 3 days. 01/17/20 01/20/20  Volney American, PA-C    Family History Family History  Problem Relation Age of Onset  . Healthy Mother   . Healthy Father     Social History Social History   Tobacco Use  . Smoking status: Current Every Day Smoker  . Smokeless tobacco: Never Used  Substance Use Topics  . Alcohol use: Yes    Comment: 2-3 times per week, 2 drinks per time  . Drug use: Yes    Types: Marijuana     Allergies   Patient has no known allergies.   Review of  Systems Review of Systems PER HPI    Physical Exam Triage Vital Signs ED Triage Vitals  Enc Vitals Group     BP 01/17/20 1139 122/86     Pulse Rate 01/17/20 1139 90     Resp 01/17/20 1139 18     Temp 01/17/20 1139 98.2 F (36.8 C)     Temp Source 01/17/20 1139 Oral     SpO2 01/17/20 1139 98 %     Weight --      Height --      Head Circumference --      Peak Flow --      Pain Score 01/17/20 1140 8     Pain Loc --      Pain Edu? --      Excl. in [place]? --    No data found.  Updated Vital Signs BP 122/86 (BP Location: Right Arm)   Pulse 90   Temp 98.2 F (36.8 C) (Oral)   Resp 18   LMP 01/13/2020   SpO2 98%   Visual Acuity Right Eye Distance:   Left Eye Distance:   Bilateral Distance:    Right Eye Near:   Left Eye Near:    Bilateral  Near:     Physical Exam Vitals and nursing note reviewed.  Constitutional:      Appearance: Normal appearance. She is not ill-appearing.  HENT:     Head: Atraumatic.  Eyes:     Extraocular Movements: Extraocular movements intact.     Conjunctiva/sclera: Conjunctivae normal.  Cardiovascular:     Rate and Rhythm: Normal rate and regular rhythm.     Heart sounds: Normal heart sounds.  Pulmonary:     Effort: Pulmonary effort is normal.     Breath sounds: Normal breath sounds.  Abdominal:     General: Bowel sounds are normal. There is no distension.     Palpations: Abdomen is soft.     Tenderness: There is no abdominal tenderness. There is no right CVA tenderness, left CVA tenderness or guarding.  Musculoskeletal:        General: Normal range of motion.     Cervical back: Normal range of motion and neck supple.  Skin:    General: Skin is warm and dry.  Neurological:     Mental Status: She is alert and oriented to person, place, and time.  Psychiatric:        Mood and Affect: Mood normal.        Thought Content: Thought content normal.        Judgment: Judgment normal.      UC Treatments / Results  Labs (all labs  ordered are listed, but only abnormal results are displayed) Labs Reviewed  POCT URINALYSIS DIPSTICK, ED / UC - Abnormal; Notable for the following components:      Result Value   Hgb urine dipstick MODERATE (*)    Leukocytes,Ua SMALL (*)    All other components within normal limits  CERVICOVAGINAL ANCILLARY ONLY    EKG   Radiology No results found.  Procedures Procedures (including critical care time)  Medications Ordered in UC Medications - No data to display  Initial Impression / Assessment and Plan / UC Course  I have reviewed the triage vital signs and the nursing notes.  Pertinent labs & imaging results that were available during my care of the patient were reviewed by me and considered in my medical decision making (see chart for details).     U/A showing RBCs and leukocytes, will start bactrim while waiting for aptima swab results. Adjust treatment if needed based on the results. Push fluids, probiotics. Return if worsening.   Final Clinical Impressions(s) / UC Diagnoses   Final diagnoses:  Acute cystitis with hematuria  Vaginal discharge   Discharge Instructions   None    ED Prescriptions    Medication Sig Dispense Auth. Provider   sulfamethoxazole-trimethoprim (BACTRIM DS) 800-160 MG tablet Take 1 tablet by mouth 2 (two) times daily for 3 days. 6 tablet Volney American, [place]     PDMP not reviewed this encounter.   Volney American, [place] 01/17/20 1237

## 2020-01-19 LAB — CERVICOVAGINAL ANCILLARY ONLY
Bacterial Vaginitis (gardnerella): POSITIVE — AB
Candida Glabrata: NEGATIVE
Candida Vaginitis: NEGATIVE
Chlamydia: NEGATIVE
Comment: NEGATIVE
Comment: NEGATIVE
Comment: NEGATIVE
Comment: NEGATIVE
Comment: NEGATIVE
Comment: NORMAL
Neisseria Gonorrhea: POSITIVE — AB
Trichomonas: POSITIVE — AB

## 2020-01-21 ENCOUNTER — Telehealth (HOSPITAL_COMMUNITY): Payer: Self-pay | Admitting: Emergency Medicine

## 2020-01-21 MED ORDER — METRONIDAZOLE 500 MG PO TABS: 500.0000 mg | ORAL_TABLET | Freq: Two times a day (BID) | ORAL | 0 refills | Status: DC

## 2020-11-12 ENCOUNTER — Ambulatory Visit (HOSPITAL_COMMUNITY)
Admission: EM | Admit: 2020-11-12 | Discharge: 2020-11-12 | Disposition: A | Discharge status: Home / Self Care | Payer: Medicaid Other | Attending: Internal Medicine | Admitting: Internal Medicine

## 2020-11-12 ENCOUNTER — Other Ambulatory Visit: Payer: Self-pay

## 2020-11-12 ENCOUNTER — Encounter (HOSPITAL_COMMUNITY): Payer: Self-pay

## 2020-11-12 DIAGNOSIS — S161XXA Strain of muscle, fascia and tendon at neck level, initial encounter: Secondary | ICD-10-CM | POA: Diagnosis not present

## 2020-11-12 DIAGNOSIS — D1722 Benign lipomatous neoplasm of skin and subcutaneous tissue of left arm: Secondary | ICD-10-CM | POA: Diagnosis not present

## 2020-11-12 DIAGNOSIS — M79602 Pain in left arm: Secondary | ICD-10-CM | POA: Diagnosis not present

## 2020-11-12 MED ORDER — KETOROLAC TROMETHAMINE 60 MG/2ML IM SOLN
30.0000 mg | Freq: Once | INTRAMUSCULAR | Status: AC
  Administered 2020-11-12: 30 mg via INTRAMUSCULAR

## 2020-11-12 MED ORDER — KETOROLAC TROMETHAMINE 30 MG/ML IJ SOLN
INTRAMUSCULAR | Status: AC
  Filled 2020-11-12: qty 1

## 2020-11-12 NOTE — ED Triage Notes (Signed)
Pt in with c/o left arm pain and swelling that has been going on for a few days   States she was bitten by something a few years ago and has been having issues ever since  States it was warm to touch

## 2020-11-12 NOTE — Discharge Instructions (Addendum)
It seems likely that you have a left-sided neck strain that is causing your left arm pain.  You have been given ketorolac injection in urgent care today to decrease pain inflammation.  Please do not take any over-the-counter ibuprofen, Advil, Aleve for least 24 hours following injection.  Please alternate ice and heat application to neck and affected area of pain.  Go to the hospital if you develop any chest pain or shortness of breath.  The lump on your arm appears to be a lipoma which is a fatty tumor.  Please follow-up with your primary care physician as soon as possible for further evaluation with imaging.  I have requested primary care assistance for you.  Someone will reach out to you to set up a primary care appointment.  If this takes more than a few days, please follow-up with your already established primary care physician for further evaluation.

## 2020-11-12 NOTE — ED Provider Notes (Signed)
[facility]    CSN: [id number] Arrival date & time: 11/12/20  1635      History   Chief Complaint Chief Complaint  Patient presents with   Arm Pain    HPI Nicole [name] is a 40 y.o. female.   Patient presents with left-sided arm pain that has been present since last night.  Has taken over-the-counter Advil with no relief of pain.  Denies any chest pain, shortness of breath, blurry vision, headache, fatigue, dizziness.  Patient also has raised lesion to left upper arm. Denies any injury to the arm but states that she had an insect bite approximately 10 years ago in the same location of lesion that is present.  Denies any flareups of left arm pain over the past 10 years.  Patient states that raised lesion appeared approximately 1 to 2 years ago.  Lesion has not had any drainage or redness.  Patient denies any fevers.  Does have some occasional numbness and tingling in left arm.  Pain starts in left upper neck and radiates down left arm.   Arm Pain   History reviewed. No pertinent past medical history.  There are no problems to display for this patient.   Past Surgical History:  Procedure Laterality Date   CERVICAL CERCLAGE     TUBAL LIGATION      OB History   No obstetric history on file.      Home Medications    Prior to Admission medications   Medication Sig Start Date End Date Taking? Authorizing Provider  fluconazole (DIFLUCAN) 150 MG tablet Take 1 tablet (150 mg total) by mouth every 3 (three) days. Patient not taking: Reported on 03/16/2017 10/18/16   [name], MD  metroNIDAZOLE (FLAGYL) 500 MG tablet Take 1 tablet (500 mg total) by mouth 2 (two) times daily. 01/21/20   Lamptey, Myrene Galas, MD  predniSONE (STERAPRED UNI-PAK 21 TAB) 10 MG (21) TBPK tablet 6 tabs for 1 day, then 5 tabs for 1 das, then 4 tabs for 1 day, then 3 tabs for 1 day, 2 tabs for 1 day, then 1 tab for 1 day 02/24/18   Orvan July, NP    Family History Family History   Problem Relation Age of Onset   Healthy Mother    Healthy Father     Social History Social History   Tobacco Use   Smoking status: Every Day   Smokeless tobacco: Never  Substance Use Topics   Alcohol use: Yes    Comment: 2-3 times per week, 2 drinks per time   Drug use: Yes    Types: Marijuana     Allergies   Patient has no known allergies.   Review of Systems Review of Systems Per HPI  Physical Exam Triage Vital Signs ED Triage Vitals  Enc Vitals Group     BP 11/12/20 1724 (!) 130/93     Pulse Rate 11/12/20 1723 79     Resp 11/12/20 1723 20     Temp 11/12/20 1723 98.4 F (36.9 C)     Temp Source 11/12/20 1723 Oral     SpO2 11/12/20 1723 97 %     Weight --      Height --      Head Circumference --      Peak Flow --      Pain Score 11/12/20 1722 10     Pain Loc --      Pain Edu? --  Excl. in [place]? --    No data found.  Updated Vital Signs BP (!) 130/93 (BP Location: Right Arm)   Pulse 79   Temp 98.4 F (36.9 C) (Oral)   Resp 20   LMP 11/05/2020 (Exact Date)   SpO2 97%   Visual Acuity Right Eye Distance:   Left Eye Distance:   Bilateral Distance:    Right Eye Near:   Left Eye Near:    Bilateral Near:     Physical Exam Constitutional:      Appearance: Normal appearance.  HENT:     Head: Normocephalic and atraumatic.  Eyes:     Extraocular Movements: Extraocular movements intact.     Conjunctiva/sclera: Conjunctivae normal.  Cardiovascular:     Rate and Rhythm: Normal rate and regular rhythm.     Pulses: Normal pulses.     Heart sounds: Normal heart sounds.  Pulmonary:     Effort: Pulmonary effort is normal.     Breath sounds: Normal breath sounds.  Musculoskeletal:     Cervical back: Tenderness present. No swelling or bony tenderness.     Thoracic back: Normal.     Lumbar back: Normal.     Comments: Tenderness palpation to left lateral neck musculature that extends into left trapezius and down left arm.  Neurovascular intact.   Capillary refill and pulses normal.  Skin:    General: Skin is warm and dry.     Findings: Lesion present.     Comments: Raised lesion that is approximately 2 to 3 inches in diameter present to left upper arm.  Lesion is not erythematous and does not have any drainage.  Lesion is nonmovable.   Neurological:     General: No focal deficit present.     Mental Status: She is alert and oriented to person, place, and time. Mental status is at baseline.     Cranial Nerves: Cranial nerves are intact.     Sensory: Sensation is intact.     Motor: Motor function is intact.     Coordination: Coordination is intact.     Gait: Gait is intact.  Psychiatric:        Mood and Affect: Mood normal.        Behavior: Behavior normal.        Thought Content: Thought content normal.        Judgment: Judgment normal.     UC Treatments / Results  Labs (all labs ordered are listed, but only abnormal results are displayed) Labs Reviewed - No data to display  EKG   Radiology No results found.  Procedures Procedures (including critical care time)  Medications Ordered in UC Medications  ketorolac (TORADOL) injection 30 mg (30 mg Intramuscular Given 11/12/20 1809)    Initial Impression / Assessment and Plan / UC Course  I have reviewed the triage vital signs and the nursing notes.  Pertinent labs & imaging results that were available during my care of the patient were reviewed by me and considered in my medical decision making (see chart for details).     Patient has tenderness to palpation to left neck that causes pain to go down her arm.  Suspect left muscle strain that is causing radiation of pain down her arm.  Ketorolac injection administered in urgent care to decrease pain and inflammation.  Advised patient to avoid any over-the-counter NSAIDs for at least 24 hours.  Patient to alternate ice and heat application to affected area of pain.  Provided patient with contact  information for orthopedic  sports medicine if pain does not resolve in the next 1 to 2 weeks.  Advised patient to go to the hospital if any chest pain or shortness of breath develop.  Lesion to left upper arm is most consistent with lipoma.  Advised patient to follow-up with primary care physician as soon as possible for further evaluation and management as this may need imaging with ultrasound.  Low suspicion for any abscess or worrisome clinical signs and symptoms related to lesion.Discussed strict return precautions. Patient verbalized understanding and is agreeable with plan.  Final Clinical Impressions(s) / UC Diagnoses   Final diagnoses:  Strain of neck muscle, initial encounter  Left arm pain  Lipoma of left upper extremity     Discharge Instructions      It seems likely that you have a left-sided neck strain that is causing your left arm pain.  You have been given ketorolac injection in urgent care today to decrease pain inflammation.  Please do not take any over-the-counter ibuprofen, Advil, Aleve for least 24 hours following injection.  Please alternate ice and heat application to neck and affected area of pain.  Go to the hospital if you develop any chest pain or shortness of breath.  The lump on your arm appears to be a lipoma which is a fatty tumor.  Please follow-up with your primary care physician as soon as possible for further evaluation with imaging.  I have requested primary care assistance for you.  Someone will reach out to you to set up a primary care appointment.  If this takes more than a few days, please follow-up with your already established primary care physician for further evaluation.     ED Prescriptions   None    PDMP not reviewed this encounter.   Odis Luster, FNP 11/12/20 1858

## 2021-03-09 ENCOUNTER — Ambulatory Visit
Admission: EM | Admit: 2021-03-09 | Discharge: 2021-03-09 | Disposition: A | Discharge status: Home / Self Care | Payer: 59 | Attending: Internal Medicine | Admitting: Internal Medicine

## 2021-03-09 ENCOUNTER — Other Ambulatory Visit: Payer: Self-pay

## 2021-03-09 DIAGNOSIS — Z20822 Contact with and (suspected) exposure to covid-19: Secondary | ICD-10-CM

## 2021-03-09 DIAGNOSIS — R112 Nausea with vomiting, unspecified: Secondary | ICD-10-CM

## 2021-03-09 DIAGNOSIS — R197 Diarrhea, unspecified: Secondary | ICD-10-CM

## 2021-03-09 DIAGNOSIS — R6889 Other general symptoms and signs: Secondary | ICD-10-CM | POA: Diagnosis not present

## 2021-03-09 MED ORDER — ONDANSETRON 4 MG PO TBDP: 4.0000 mg | ORAL_TABLET | Freq: Three times a day (TID) | ORAL | 0 refills | Status: DC | PRN

## 2021-03-09 NOTE — ED Provider Notes (Signed)
[facility]    CSN: [id number] Arrival date & time: 03/09/21  1728      History   Chief Complaint Chief Complaint  Patient presents with   Fatigue    HPI Carrieann Spielberg is a 40 y.o. female.   Patient presents with nausea, vomiting, diarrhea, fatigue, nasal congestion, body aches, chills that started yesterday.  Denies any blood in stool or emesis.  Denies any known sick contacts.  Denies sore throat, ear pain, abdominal pain.  Patient has been able to keep food and fluids down.  Denies any known fevers.  Patient believes that she has food poisoning as she states that her family member does not know how to "wash their hands properly when preparing food".    History reviewed. No pertinent past medical history.  There are no problems to display for this patient.   Past Surgical History:  Procedure Laterality Date   CERVICAL CERCLAGE     TUBAL LIGATION      OB History   No obstetric history on file.      Home Medications    Prior to Admission medications   Medication Sig Start Date End Date Taking? Authorizing Provider  ondansetron (ZOFRAN-ODT) 4 MG disintegrating tablet Take 1 tablet (4 mg total) by mouth every 8 (eight) hours as needed for nausea or vomiting. 03/09/21  Yes Torez Beauregard, Hildred Alamin E, FNP  fluconazole (DIFLUCAN) 150 MG tablet Take 1 tablet (150 mg total) by mouth every 3 (three) days. Patient not taking: Reported on 03/16/2017 10/18/16   [name], MD  metroNIDAZOLE (FLAGYL) 500 MG tablet Take 1 tablet (500 mg total) by mouth 2 (two) times daily. 01/21/20   Lamptey, Myrene Galas, MD  predniSONE (STERAPRED UNI-PAK 21 TAB) 10 MG (21) TBPK tablet 6 tabs for 1 day, then 5 tabs for 1 das, then 4 tabs for 1 day, then 3 tabs for 1 day, 2 tabs for 1 day, then 1 tab for 1 day 02/24/18   Orvan July, NP    Family History Family History  Problem Relation Age of Onset   Healthy Mother    Healthy Father     Social History Social History   Tobacco Use    Smoking status: Every Day   Smokeless tobacco: Never  Substance Use Topics   Alcohol use: Yes    Comment: 2-3 times per week, 2 drinks per time   Drug use: Yes    Types: Marijuana     Allergies   Patient has no known allergies.   Review of Systems Review of Systems Per HPI  Physical Exam Triage Vital Signs ED Triage Vitals [03/09/21 1834]  Enc Vitals Group     BP 126/85     Pulse Rate (!) 102     Resp 18     Temp 98.8 F (37.1 C)     Temp Source Oral     SpO2 99 %     Weight      Height      Head Circumference      Peak Flow      Pain Score 0     Pain Loc      Pain Edu?      Excl. in [place]?    No data found.  Updated Vital Signs BP 126/85 (BP Location: Left Arm)   Pulse (!) 102   Temp 98.8 F (37.1 C) (Oral)   Resp 18   SpO2 99%   Visual Acuity Right Eye  Distance:   Left Eye Distance:   Bilateral Distance:    Right Eye Near:   Left Eye Near:    Bilateral Near:     Physical Exam Constitutional:      General: She is not in acute distress.    Appearance: Normal appearance. She is not toxic-appearing or diaphoretic.  HENT:     Head: Normocephalic and atraumatic.     Right Ear: Tympanic membrane and ear canal normal.     Left Ear: Tympanic membrane and ear canal normal.     Nose: Congestion present.     Mouth/Throat:     Mouth: Mucous membranes are moist.     Pharynx: No posterior oropharyngeal erythema.  Eyes:     Extraocular Movements: Extraocular movements intact.     Conjunctiva/sclera: Conjunctivae normal.  Cardiovascular:     Rate and Rhythm: Normal rate and regular rhythm.     Pulses: Normal pulses.     Heart sounds: Normal heart sounds.  Pulmonary:     Effort: Pulmonary effort is normal. No respiratory distress.     Breath sounds: Normal breath sounds.  Abdominal:     General: Abdomen is flat. Bowel sounds are normal. There is no distension.     Palpations: Abdomen is soft.     Tenderness: There is no abdominal tenderness.   Neurological:     General: No focal deficit present.     Mental Status: She is alert and oriented to person, place, and time. Mental status is at baseline.  Psychiatric:        Mood and Affect: Mood normal.        Behavior: Behavior normal.        Thought Content: Thought content normal.        Judgment: Judgment normal.     UC Treatments / Results  Labs (all labs ordered are listed, but only abnormal results are displayed) Labs Reviewed  COVID-19, FLU A+B NAA    EKG   Radiology No results found.  Procedures Procedures (including critical care time)  Medications Ordered in UC Medications - No data to display  Initial Impression / Assessment and Plan / UC Course  I have reviewed the triage vital signs and the nursing notes.  Pertinent labs & imaging results that were available during my care of the patient were reviewed by me and considered in my medical decision making (see chart for details).     Symptoms and physical exam appear viral in etiology.  COVID-19 and flu test pending.  Ondansetron to take as needed for nausea.  Patient to increase clear oral fluid intake.  No signs of dehydration at this time.  Discussed strict return precautions.  Patient verbalized understanding and was agreeable with plan. Final Clinical Impressions(s) / UC Diagnoses   Final diagnoses:  Nausea vomiting and diarrhea  Encounter for laboratory testing for COVID-19 virus  Flu-like symptoms     Discharge Instructions      It appears that you have a viral illness that is causing your symptoms that should resolve in the next few days.  You have been prescribed nausea medication to take as needed.  Please increase clear oral fluid intake.  COVID-19 and flu test is pending.  We will call if it is positive.    ED Prescriptions     Medication Sig Dispense Auth. Provider   ondansetron (ZOFRAN-ODT) 4 MG disintegrating tablet Take 1 tablet (4 mg total) by mouth every 8 (eight) hours as  needed for  nausea or vomiting. 20 tablet [place], Michele Rockers, [place]      PDMP not reviewed this encounter.   Teodora Medici, [place] 03/09/21 [phone]

## 2021-03-09 NOTE — Discharge Instructions (Addendum)
It appears that you have a viral illness that is causing your symptoms that should resolve in the next few days.  You have been prescribed nausea medication to take as needed.  Please increase clear oral fluid intake.  COVID-19 and flu test is pending.  We will call if it is positive.

## 2021-03-09 NOTE — ED Triage Notes (Signed)
Pt c/o nausea, fatigue, diarrhea, nasal congestion, lightheadedness, cough, headaches, body aches and chills.   Denies sore throat  Onset today.

## 2021-03-10 LAB — COVID-19, FLU A+B NAA
Influenza A, NAA: NOT DETECTED
Influenza B, NAA: NOT DETECTED
SARS-CoV-2, NAA: NOT DETECTED

## 2021-05-01 IMAGING — MG DIGITAL DIAGNOSTIC UNILATERAL LEFT MAMMOGRAM WITH TOMO AND CAD
4 series · 4 of 12 positions shown · non-contrast
Comparison: Previous exam(s).

CLINICAL DATA: Possible mass in the posterior aspect of the lower
outer left breast on a baseline screening mammogram.

EXAM:
DIGITAL DIAGNOSTIC LEFT MAMMOGRAM WITH CAD AND TOMO
ULTRASOUND LEFT BREAST

[L CC synth-2D]
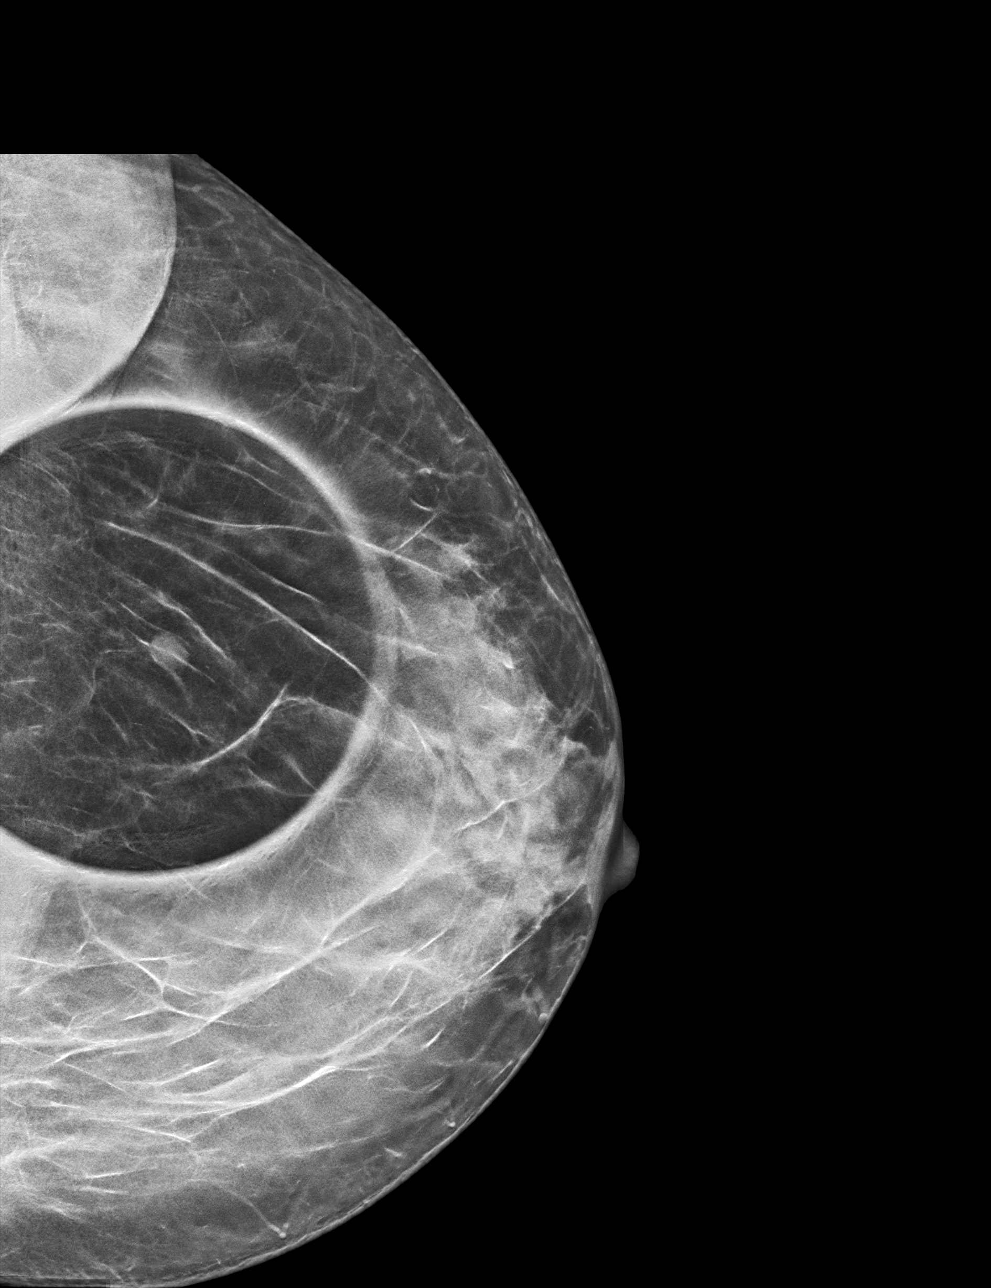

[L MLO synth-2D]
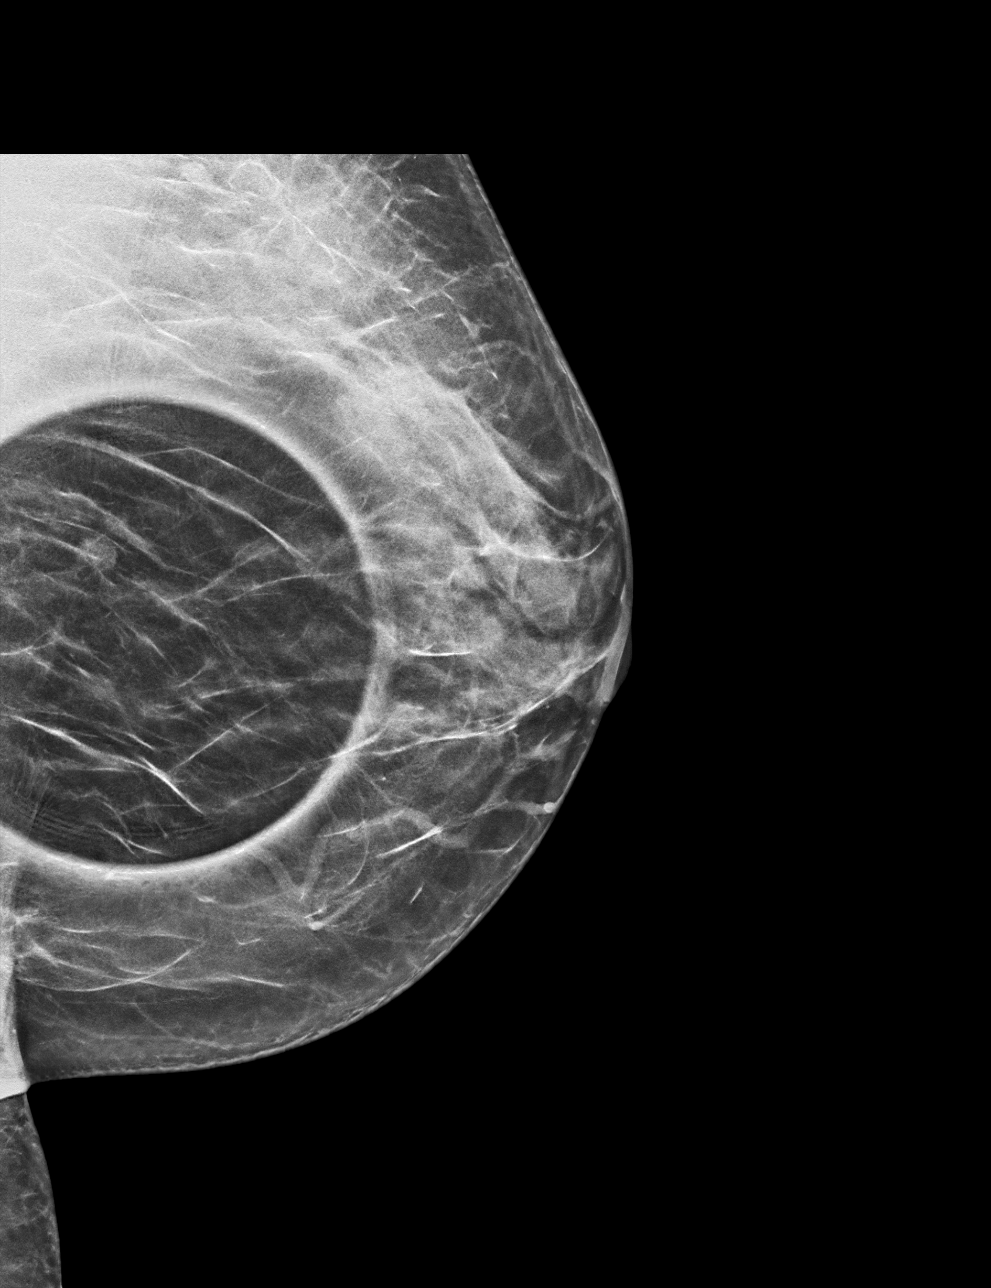

[L MLO tomo · tomo slice 23/46.0]
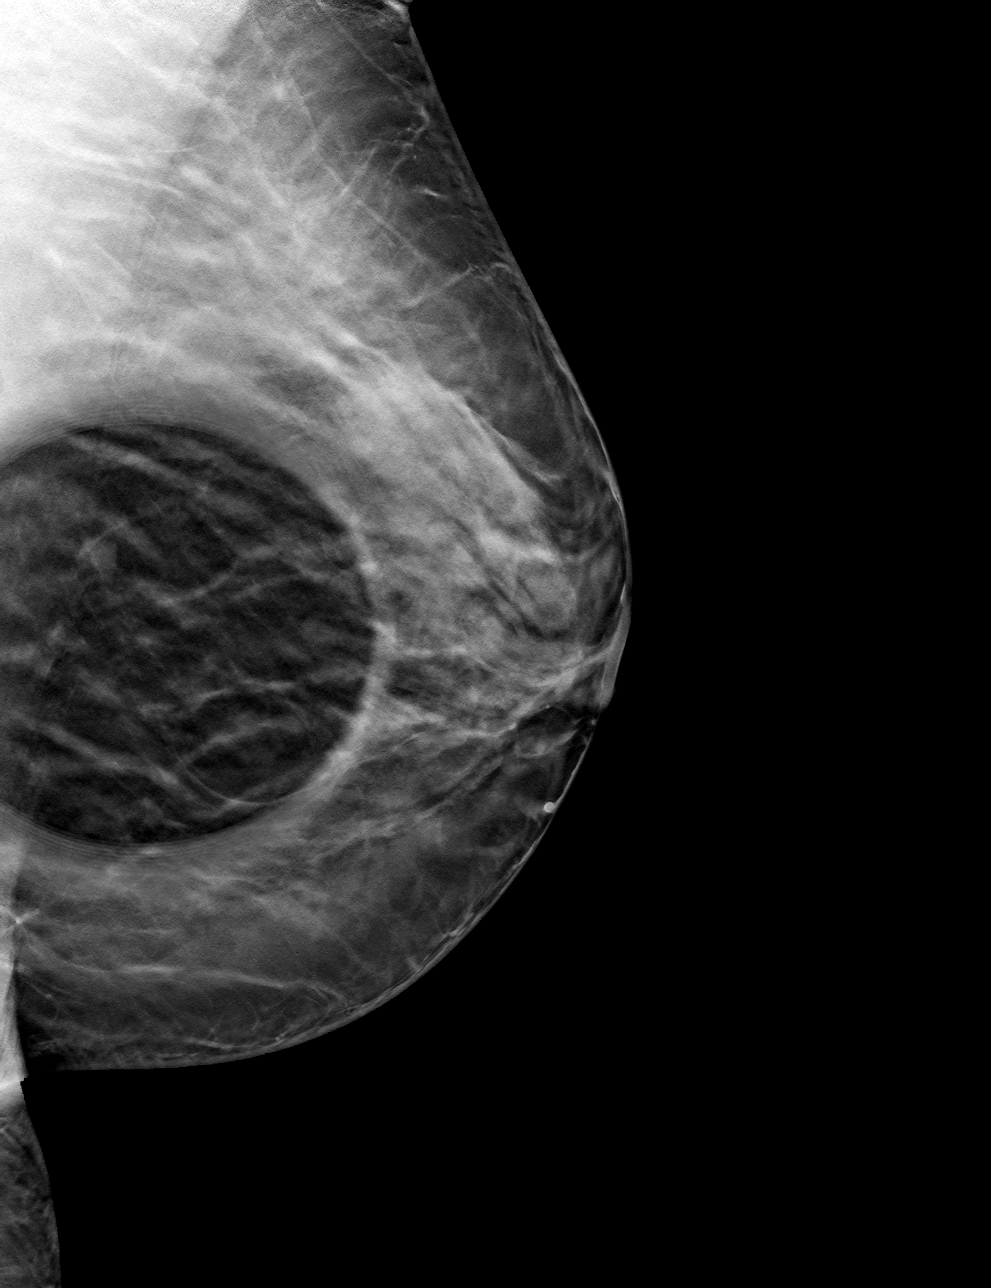

[L CC tomo · tomo slice 23/46.0]
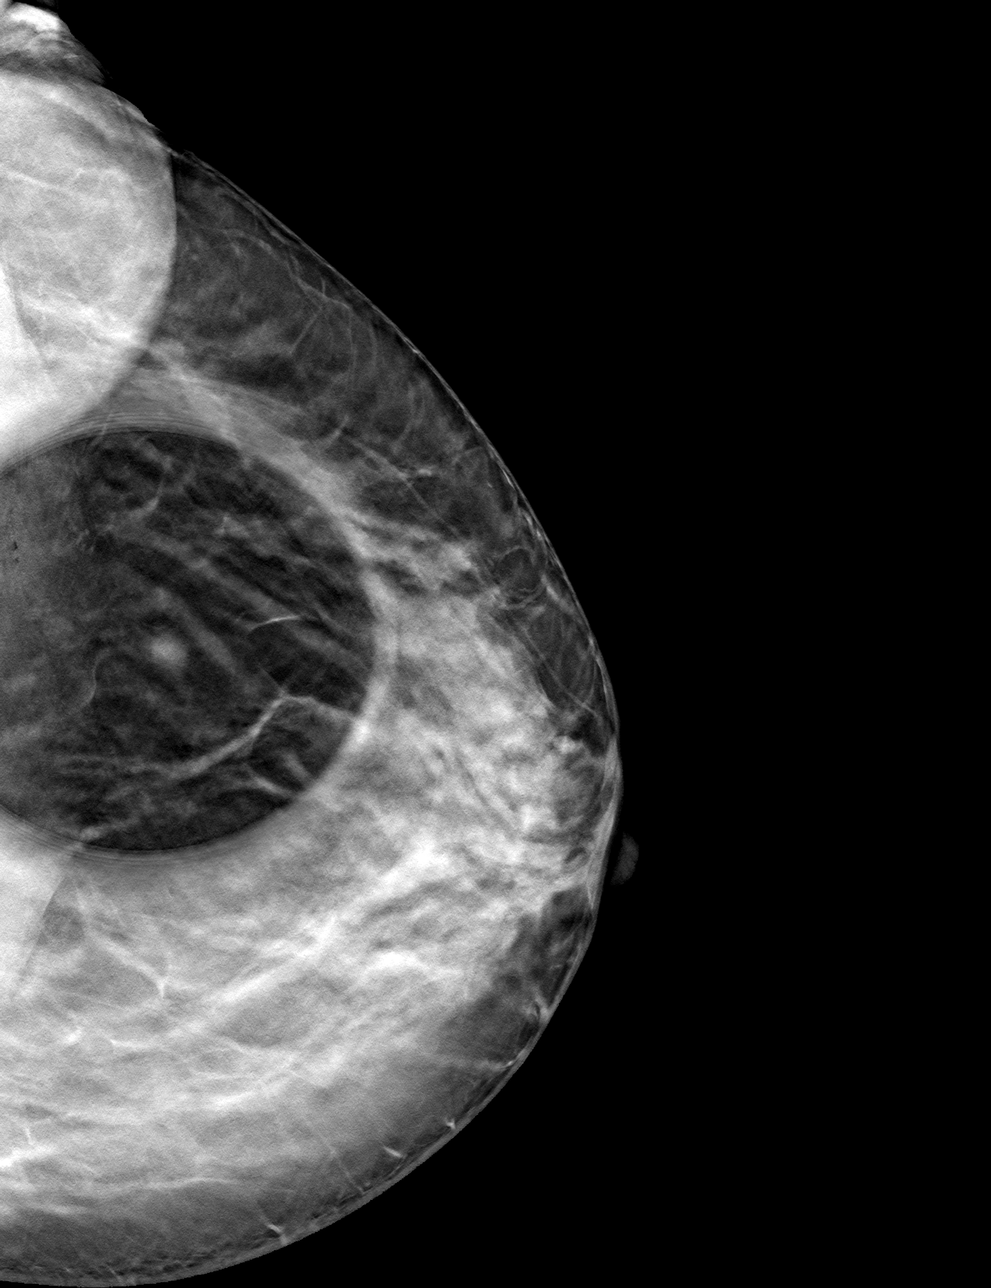

[4 of 12 positions shown; findings below may reference images not displayed]

ACR Breast Density Category c: The breast tissue is heterogeneously
dense, which may obscure small masses.
FINDINGS: A small, oval, circumscribed mass is confirmed in the posterior
aspect of the lower outer left breast in approximately the 5 o'clock
position.

Mammographic images were processed with CAD.

Targeted ultrasound is performed, showing a 7 mm cyst containing
minimal internal echoes in the 4 o'clock position of the left
breast, 8 cm from the nipple. There was no internal blood flow with
power Doppler.
IMPRESSION: Benign left breast cyst.  No evidence of malignancy.

RECOMMENDATION:
Bilateral screening mammogram in 1 year

I have discussed the findings and recommendations with the patient.
Results were also provided in writing at the conclusion of the
visit. If applicable, a reminder letter will be sent to the patient
regarding the next appointment.

BI-RADS CATEGORY  2: Benign.

## 2021-05-01 IMAGING — US ULTRASOUND LEFT BREAST LIMITED
1 series · 5 of 5 positions shown · non-contrast
Comparison: Previous exam(s).

CLINICAL DATA: Possible mass in the posterior aspect of the lower
outer left breast on a baseline screening mammogram.

EXAM:
DIGITAL DIAGNOSTIC LEFT MAMMOGRAM WITH CAD AND TOMO
ULTRASOUND LEFT BREAST

[Series 1: ultrasound left breast limited · 0.06mm/px · 5 of 5 slices shown]
[im 1/5]
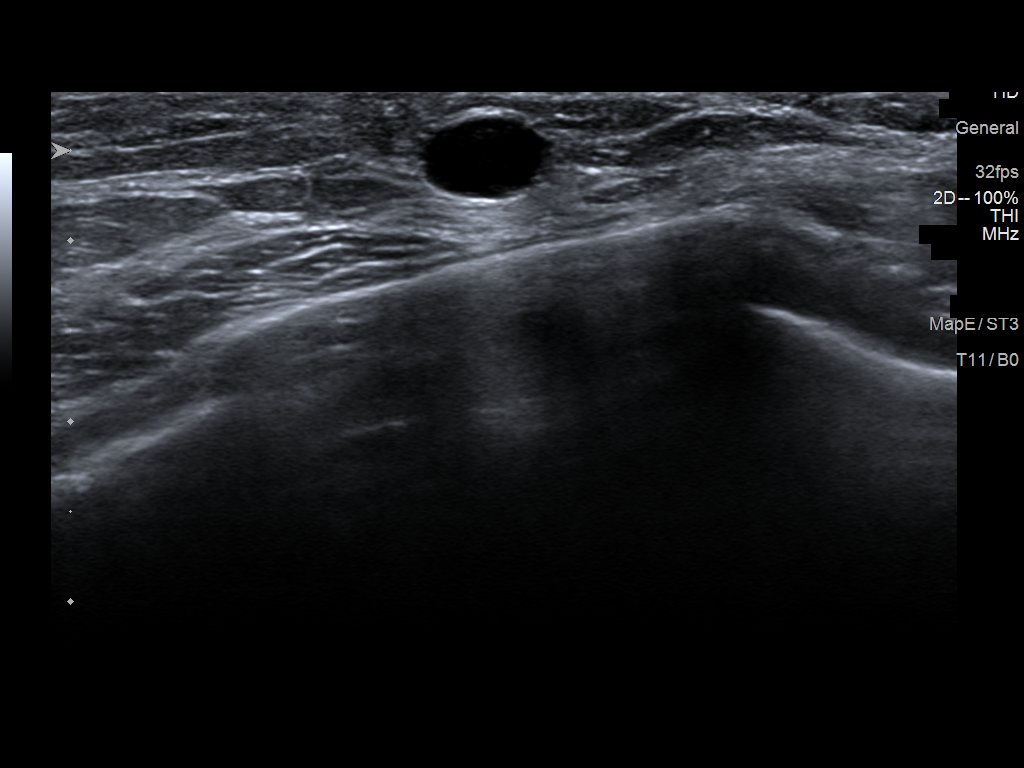
[im 2/5]
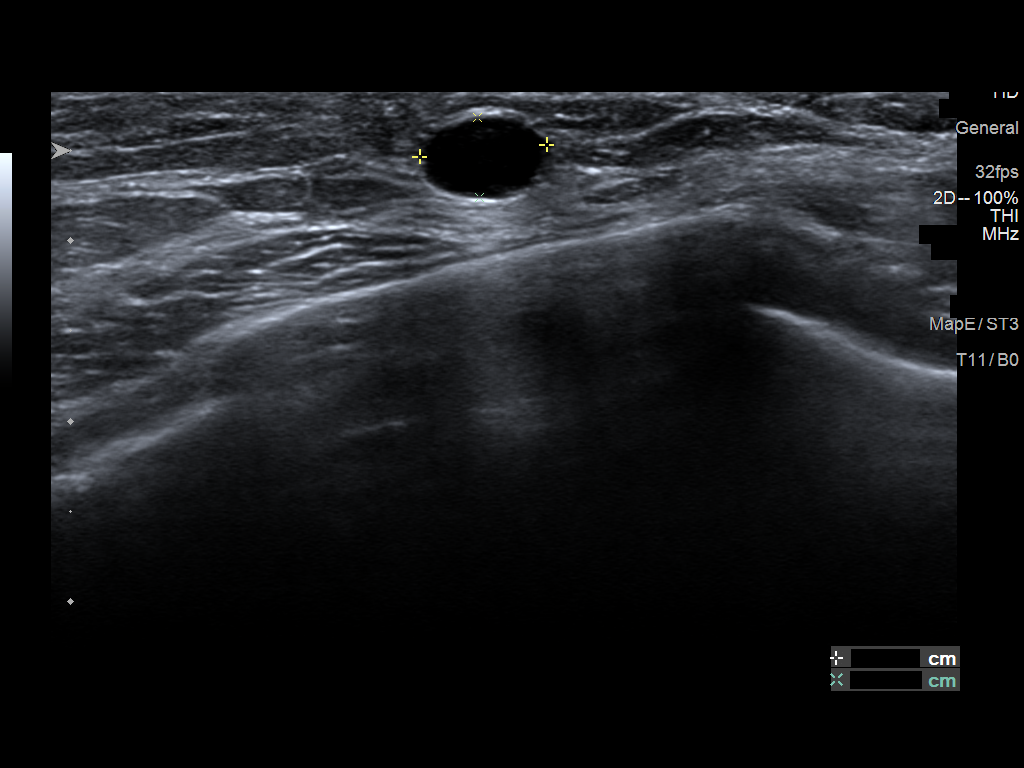
[im 3/5]
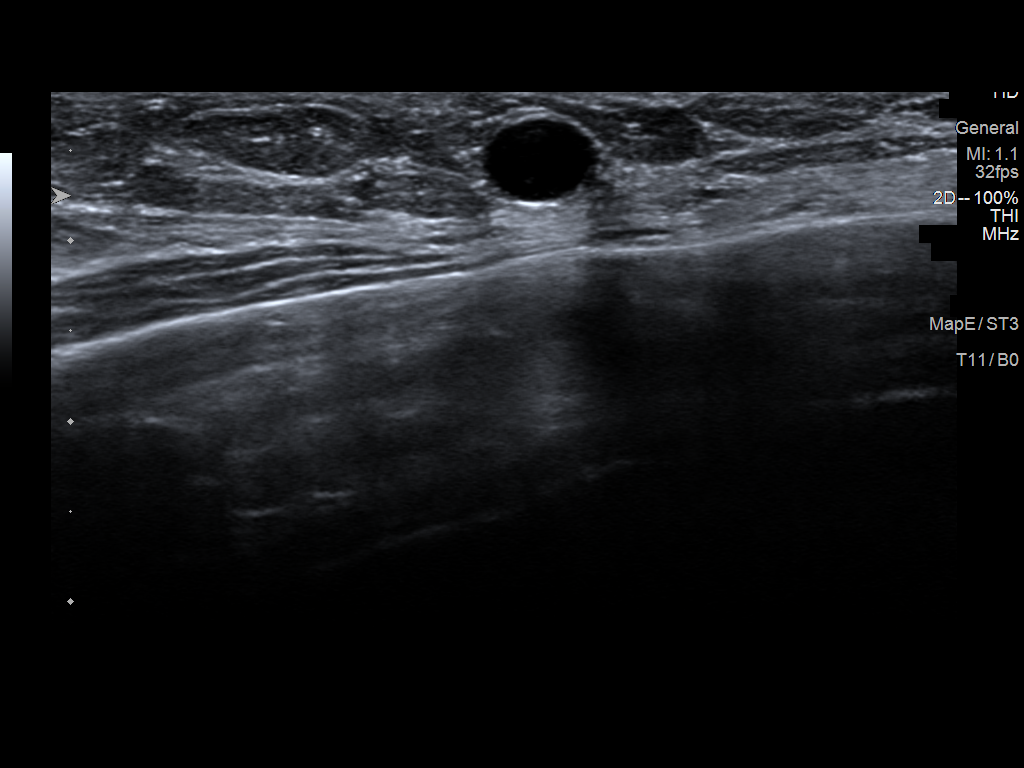
[im 4/5]
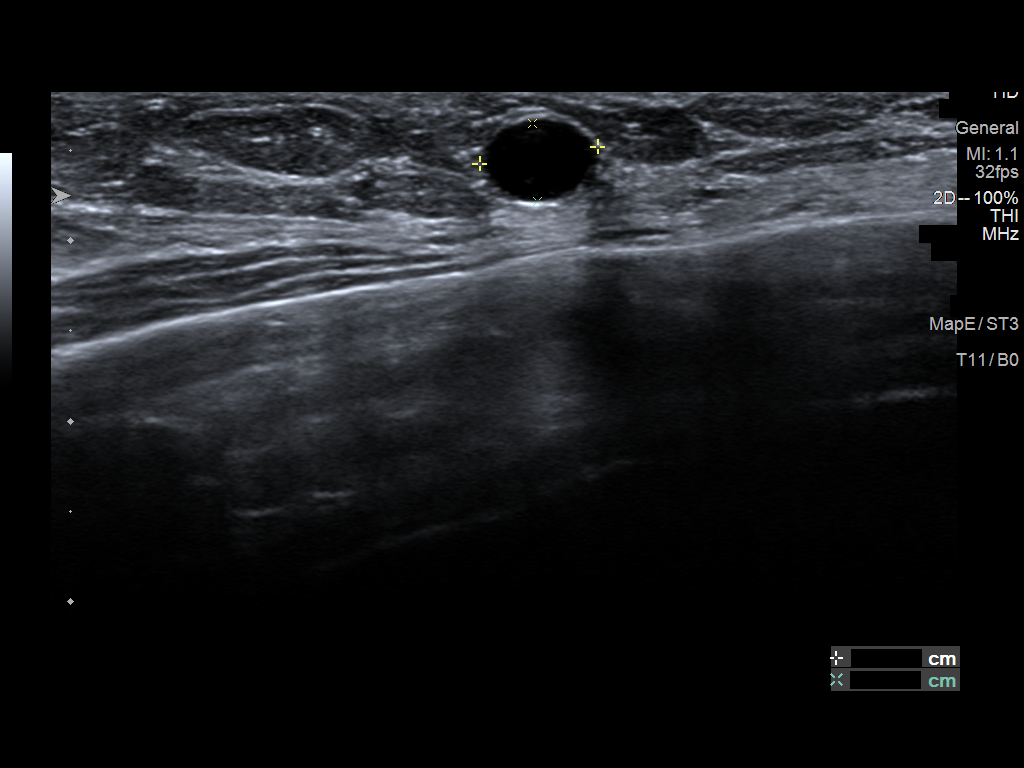
[im 5/5]
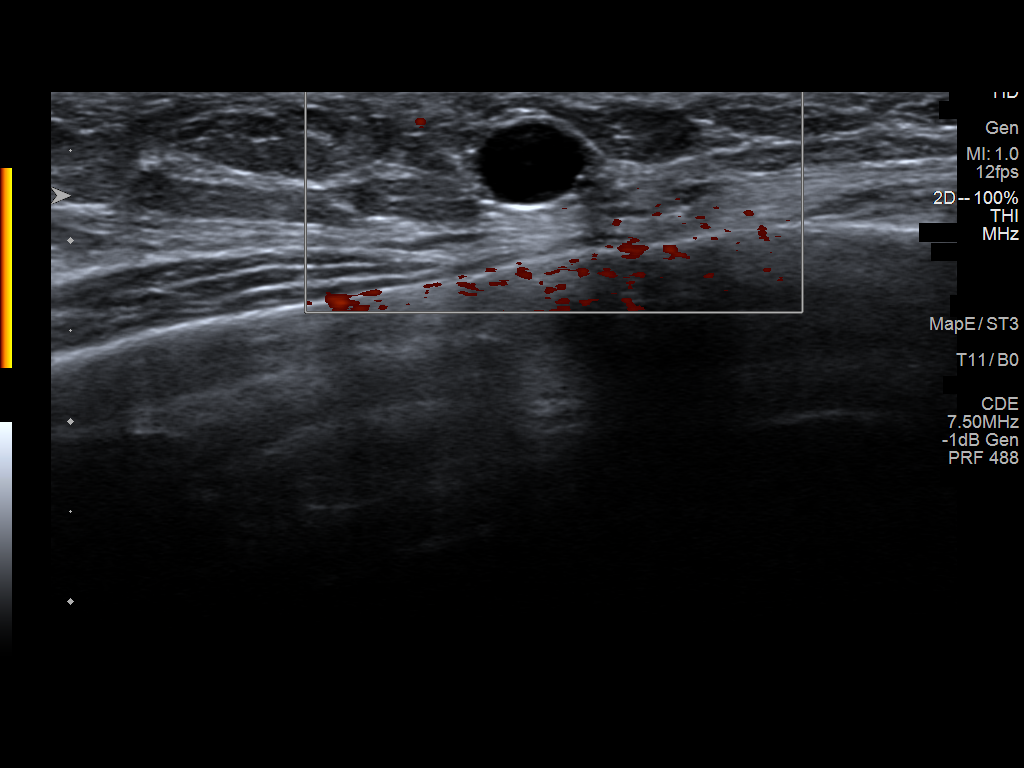

[5 of 5 positions shown; findings below may reference images not displayed]

ACR Breast Density Category c: The breast tissue is heterogeneously
dense, which may obscure small masses.
FINDINGS: A small, oval, circumscribed mass is confirmed in the posterior
aspect of the lower outer left breast in approximately the 5 o'clock
position.

Mammographic images were processed with CAD.

Targeted ultrasound is performed, showing a 7 mm cyst containing
minimal internal echoes in the 4 o'clock position of the left
breast, 8 cm from the nipple. There was no internal blood flow with
power Doppler.
IMPRESSION: Benign left breast cyst.  No evidence of malignancy.

RECOMMENDATION:
Bilateral screening mammogram in 1 year

I have discussed the findings and recommendations with the patient.
Results were also provided in writing at the conclusion of the
visit. If applicable, a reminder letter will be sent to the patient
regarding the next appointment.

BI-RADS CATEGORY  2: Benign.

## 2022-11-06 ENCOUNTER — Ambulatory Visit (HOSPITAL_COMMUNITY)
Admission: EM | Admit: 2022-11-06 | Discharge: 2022-11-06 | Disposition: A | Discharge status: Home / Self Care | Payer: Medicaid Other | Attending: Family Medicine | Admitting: Family Medicine

## 2022-11-06 ENCOUNTER — Encounter (HOSPITAL_COMMUNITY): Payer: Self-pay

## 2022-11-06 DIAGNOSIS — R42 Dizziness and giddiness: Secondary | ICD-10-CM | POA: Diagnosis present

## 2022-11-06 DIAGNOSIS — R112 Nausea with vomiting, unspecified: Secondary | ICD-10-CM | POA: Insufficient documentation

## 2022-11-06 LAB — BASIC METABOLIC PANEL
Anion gap: 10 (ref 5–15)
BUN: 14 mg/dL (ref 6–20)
CO2: 25 mmol/L (ref 22–32)
Calcium: 10.4 mg/dL — ABNORMAL HIGH (ref 8.9–10.3)
Chloride: 102 mmol/L (ref 98–111)
Creatinine, Ser: 2.05 mg/dL — ABNORMAL HIGH (ref 0.44–1.00)
GFR, Estimated: 30 mL/min — ABNORMAL LOW (ref >60)
Glucose, Bld: 99 mg/dL (ref 70–99)
Potassium: 3.9 mmol/L (ref 3.5–5.1)
Sodium: 137 mmol/L (ref 135–145)

## 2022-11-06 MED ORDER — ONDANSETRON 4 MG PO TBDP: 4.0000 mg | ORAL_TABLET | Freq: Three times a day (TID) | ORAL | 0 refills | Status: DC | PRN

## 2022-11-06 MED ORDER — FAMOTIDINE 20 MG PO TABS: 20.0000 mg | ORAL_TABLET | Freq: Two times a day (BID) | ORAL | 0 refills | Status: AC

## 2022-11-06 NOTE — ED Triage Notes (Signed)
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 Patient also c/o feeling dizzy when she entering and exiting her vehicle frequently.

## 2022-11-06 NOTE — Discharge Instructions (Addendum)
Take famotidine 20 mg--1 tablet 2 times daily.  This is for stomach acid and acid reflux  Ondansetron dissolved in the mouth every 8 hours as needed for nausea or vomiting.  We have drawn blood to check your electrolytes.  Staff will notify you if there is anything significantly abnormal

## 2022-11-06 NOTE — ED Provider Notes (Signed)
[facility]    CSN: [id number] Arrival date & time: 11/06/22  1738      History   Chief Complaint Chief Complaint  Patient presents with   Emesis   Dizziness    HPI Nicole [name] is a 42 y.o. female.    Emesis Dizziness Associated symptoms: vomiting    Here for feeling dizzy and nausea and vomiting.  She states she feels like she got overheated doing her job as a [employer].  She ended up feeling very hot and had several episodes of emesis, about 6.  That was about 1:00 this afternoon, about 6 hours earlier.  She is now not nauseated.  Earlier she was also feeling very dizzy and lightheaded, but she states some customers help her cool down.  No fever or chills and no cough or congestion.   last menstrual cycle was July 8.    She does note some epigastric pain when she was throwing up.  That is improved but she is still feeling a little bit of discomfort there. History reviewed. No pertinent past medical history.  There are no problems to display for this patient.   Past Surgical History:  Procedure Laterality Date   CERVICAL CERCLAGE     TUBAL LIGATION      OB History   No obstetric history on file.      Home Medications    Prior to Admission medications   Medication Sig Start Date End Date Taking? Authorizing Provider  famotidine (PEPCID) 20 MG tablet Take 1 tablet (20 mg total) by mouth 2 (two) times daily. 11/06/22  Yes Zenia Resides, MD  ondansetron (ZOFRAN-ODT) 4 MG disintegrating tablet Take 1 tablet (4 mg total) by mouth every 8 (eight) hours as needed for nausea or vomiting. 11/06/22  Yes Carol Theys, Janace Aris, MD    Family History Family History  Problem Relation Age of Onset   Healthy Mother    Healthy Father     Social History Social History   Tobacco Use   Smoking status: Every Day    Current packs/day: 0.50    Types: Cigarettes   Smokeless tobacco: Never  Vaping Use   Vaping status: Never Used  Substance  Use Topics   Alcohol use: Yes    Comment: 2-3 times per week, 2 drinks per time   Drug use: Yes    Types: Marijuana     Allergies   Patient has no known allergies.   Review of Systems Review of Systems  Gastrointestinal:  Positive for vomiting.  Neurological:  Positive for dizziness.     Physical Exam Triage Vital Signs ED Triage Vitals  Encounter Vitals Group     BP 11/06/22 1854 (!) 131/91     Systolic BP Percentile --      Diastolic BP Percentile --      Pulse Rate 11/06/22 1854 (!) 108     Resp 11/06/22 1854 16     Temp 11/06/22 1854 98.2 F (36.8 C)     Temp Source 11/06/22 1854 Oral     SpO2 11/06/22 1854 99 %     Weight --      Height --      Head Circumference --      Peak Flow --      Pain Score 11/06/22 1855 0     Pain Loc --      Pain Education --      Exclude from Growth Chart --  No data found.  Updated Vital Signs BP (!) 131/91 (BP Location: Left Arm)   Pulse (!) 108   Temp 98.2 F (36.8 C) (Oral)   Resp 16   LMP 10/17/2022 (Approximate)   SpO2 99%   Visual Acuity Right Eye Distance:   Left Eye Distance:   Bilateral Distance:    Right Eye Near:   Left Eye Near:    Bilateral Near:     Physical Exam Vitals reviewed.  Constitutional:      General: She is not in acute distress.    Appearance: She is not ill-appearing, toxic-appearing or diaphoretic.     Comments: She is in no distress here in the exam room.  She is coherent and talkative  HENT:     Mouth/Throat:     Mouth: Mucous membranes are moist.  Eyes:     Extraocular Movements: Extraocular movements intact.     Conjunctiva/sclera: Conjunctivae normal.     Pupils: Pupils are equal, round, and reactive to light.  Cardiovascular:     Rate and Rhythm: Normal rate and regular rhythm.     Heart sounds: No murmur heard. Pulmonary:     Effort: Pulmonary effort is normal.     Breath sounds: Normal breath sounds.  Abdominal:     Palpations: Abdomen is soft.     Comments:  Abdomen is soft without distention.  There is some mild tenderness in the epigastric area.  Musculoskeletal:     Cervical back: Neck supple.  Lymphadenopathy:     Cervical: No cervical adenopathy.  Skin:    Coloration: Skin is not jaundiced or pale.  Neurological:     General: No focal deficit present.     Mental Status: She is alert and oriented to person, place, and time.  Psychiatric:        Behavior: Behavior normal.      UC Treatments / Results  Labs (all labs ordered are listed, but only abnormal results are displayed) Labs Reviewed  BASIC METABOLIC PANEL    EKG   Radiology No results found.  Procedures Procedures (including critical care time)  Medications Ordered in UC Medications - No data to display  Initial Impression / Assessment and Plan / UC Course  I have reviewed the triage vital signs and the nursing notes.  Pertinent labs & imaging results that were available during my care of the patient were reviewed by me and considered in my medical decision making (see chart for details).        BMP is drawn today, and we will notify her if anything is significantly abnormal.  Pepcid is sent in for some possible gastritis related to the nausea and vomiting.  Zofran is sent in case she needs it, though she is not nauseated at this time.  We discussed measures to keep her cool when she is doing her work Final Clinical Impressions(s) / UC Diagnoses   Final diagnoses:  Dizziness  Nausea and vomiting, unspecified vomiting type     Discharge Instructions      Take famotidine 20 mg--1 tablet 2 times daily.  This is for stomach acid and acid reflux  Ondansetron dissolved in the mouth every 8 hours as needed for nausea or vomiting.  We have drawn blood to check your electrolytes.  Staff will notify you if there is anything significantly abnormal    ED Prescriptions     Medication Sig Dispense Auth. Provider   famotidine (PEPCID) 20 MG tablet Take 1  tablet (20  mg total) by mouth 2 (two) times daily. 30 tablet Jahmez Bily, Janace Aris, MD   ondansetron (ZOFRAN-ODT) 4 MG disintegrating tablet Take 1 tablet (4 mg total) by mouth every 8 (eight) hours as needed for nausea or vomiting. 10 tablet Marlinda Mike Janace Aris, MD      PDMP not reviewed this encounter.   Zenia Resides, MD 11/06/22 [phone]

## 2022-11-07 ENCOUNTER — Telehealth (HOSPITAL_COMMUNITY): Payer: Self-pay | Admitting: Emergency Medicine

## 2022-11-07 NOTE — Telephone Encounter (Signed)
Opened in error

## 2024-01-29 ENCOUNTER — Ambulatory Visit (HOSPITAL_COMMUNITY)
Admission: EM | Admit: 2024-01-29 | Discharge: 2024-01-29 | Disposition: A | Discharge status: Home / Self Care | Attending: Family Medicine | Admitting: Family Medicine

## 2024-01-29 ENCOUNTER — Encounter (HOSPITAL_COMMUNITY): Payer: Self-pay

## 2024-01-29 DIAGNOSIS — B349 Viral infection, unspecified: Secondary | ICD-10-CM | POA: Diagnosis not present

## 2024-01-29 DIAGNOSIS — R112 Nausea with vomiting, unspecified: Secondary | ICD-10-CM | POA: Diagnosis not present

## 2024-01-29 DIAGNOSIS — R197 Diarrhea, unspecified: Secondary | ICD-10-CM

## 2024-01-29 DIAGNOSIS — R051 Acute cough: Secondary | ICD-10-CM

## 2024-01-29 LAB — POC COVID19/FLU A&B COMBO
Covid Antigen, POC: NEGATIVE
Influenza A Antigen, POC: NEGATIVE
Influenza B Antigen, POC: NEGATIVE

## 2024-01-29 MED ORDER — PROMETHAZINE-DM 6.25-15 MG/5ML PO SYRP: 5.0000 mL | ORAL_SOLUTION | Freq: Every evening | ORAL | 0 refills | Status: AC | PRN

## 2024-01-29 MED ORDER — BENZONATATE 100 MG PO CAPS: 100.0000 mg | ORAL_CAPSULE | Freq: Three times a day (TID) | ORAL | 0 refills | Status: AC

## 2024-01-29 MED ORDER — ONDANSETRON 4 MG PO TBDP: 4.0000 mg | ORAL_TABLET | Freq: Three times a day (TID) | ORAL | 0 refills | Status: AC | PRN

## 2024-01-29 NOTE — ED Triage Notes (Signed)
 Cough and nasal congestion x 4 days. Diarrhea and vomiting started yesterday.  Home Intervention: None

## 2024-01-29 NOTE — Discharge Instructions (Addendum)
 Your COVID and flu testing are both negative today.  I believe your symptoms are likely viral in nature. You can take Zofran  every 8 hours as needed for nausea and vomiting. You can take Tessalon every 8 hours as needed for cough. Take Promethazine DM cough syrup at bedtime as needed for cough.  This can make you drowsy so do not drive, work, or drink alcohol while taking this. Make sure you are staying hydrated and getting plenty of rest. Follow-up with your primary care provider or return here as needed. If you develop worsening abdominal pain, excessive vomiting, abdominal pain or stool, high fevers, severe weakness, or passing out please seek immediate medical treatment in the emergency department.

## 2024-01-29 NOTE — ED Provider Notes (Signed)
 [facility]    CSN: [id number] Arrival date & time: 01/29/24  9068      History   Chief Complaint No chief complaint on file.   HPI Nicole [name] is a 43 y.o. female.   Patient presents with cough and nasal congestion that began about 4 days ago.  Patient states that she is also had some intermittent generalized abdominal cramping for about 4 days as well.  Patient states that yesterday she began having some diarrhea and vomiting.  Patient states that she vomited everything that she ate yesterday, and continues to have nausea today.  Patient denies any blood in her vomit or stool.  Patient denies chest pain, shortness of breath, and known fever.  Patient denies any known sick contacts.  Patient denies taking any medication for symptoms.  The history is provided by the patient and medical records.    History reviewed. No pertinent past medical history.  There are no active problems to display for this patient.   Past Surgical History:  Procedure Laterality Date   CERVICAL CERCLAGE     TUBAL LIGATION      OB History   No obstetric history on file.      Home Medications    Prior to Admission medications   Medication Sig Start Date End Date Taking? Authorizing Provider  benzonatate (TESSALON) 100 MG capsule Take 1 capsule (100 mg total) by mouth every 8 (eight) hours. 01/29/24  Yes Johnie, Jaliya Siegmann A, NP  ondansetron  (ZOFRAN -ODT) 4 MG disintegrating tablet Take 1 tablet (4 mg total) by mouth every 8 (eight) hours as needed for nausea or vomiting. 01/29/24  Yes Johnie, Mikai Meints A, NP  promethazine-dextromethorphan (PROMETHAZINE-DM) 6.25-15 MG/5ML syrup Take 5 mLs by mouth at bedtime as needed for cough. 01/29/24  Yes Johnie, Elice Crigger A, NP  famotidine  (PEPCID ) 20 MG tablet Take 1 tablet (20 mg total) by mouth 2 (two) times daily. 11/06/22   Vonna Sharlet POUR, MD    Family History Family History  Problem Relation Age of Onset   Healthy Mother    Healthy  Father     Social History Social History   Tobacco Use   Smoking status: Every Day    Current packs/day: 0.50    Types: Cigarettes   Smokeless tobacco: Never  Vaping Use   Vaping status: Never Used  Substance Use Topics   Alcohol use: Yes    Comment: 2-3 times per week, 2 drinks per time   Drug use: Yes    Types: Marijuana     Allergies   Patient has no known allergies.   Review of Systems Review of Systems  Per HPI  Physical Exam Triage Vital Signs ED Triage Vitals [01/29/24 1002]  Encounter Vitals Group     BP (!) 137/97     Girls Systolic BP Percentile      Girls Diastolic BP Percentile      Boys Systolic BP Percentile      Boys Diastolic BP Percentile      Pulse Rate 81     Resp 20     Temp 98.4 F (36.9 C)     Temp Source Oral     SpO2 98 %     Weight      Height      Head Circumference      Peak Flow      Pain Score      Pain Loc      Pain Education  Exclude from Growth Chart    No data found.  Updated Vital Signs BP (!) 137/97 (BP Location: Left Arm)   Pulse 81   Temp 98.4 F (36.9 C) (Oral)   Resp 20   LMP 12/28/2023 (Approximate)   SpO2 98%   Visual Acuity Right Eye Distance:   Left Eye Distance:   Bilateral Distance:    Right Eye Near:   Left Eye Near:    Bilateral Near:     Physical Exam Vitals and nursing note reviewed.  Constitutional:      General: She is awake. She is not in acute distress.    Appearance: Normal appearance. She is well-developed and well-groomed. She is not ill-appearing.  HENT:     Right Ear: Tympanic membrane, ear canal and external ear normal.     Left Ear: Tympanic membrane, ear canal and external ear normal.     Nose: Congestion and rhinorrhea present.     Mouth/Throat:     Mouth: Mucous membranes are moist.     Pharynx: Posterior oropharyngeal erythema and postnasal drip present. No oropharyngeal exudate.  Cardiovascular:     Rate and Rhythm: Normal rate and regular rhythm.  Pulmonary:      Effort: Pulmonary effort is normal.     Breath sounds: Normal breath sounds.  Abdominal:     General: Abdomen is flat. Bowel sounds are normal. There is no distension.     Palpations: Abdomen is soft. There is no mass.     Tenderness: There is generalized abdominal tenderness. There is no guarding or rebound.     Hernia: No hernia is present.     Comments: Mild generalized tenderness noted  Skin:    General: Skin is warm and dry.  Neurological:     Mental Status: She is alert.  Psychiatric:        Behavior: Behavior is cooperative.      UC Treatments / Results  Labs (all labs ordered are listed, but only abnormal results are displayed) Labs Reviewed  POC COVID19/FLU A&B COMBO    EKG   Radiology No results found.  Procedures Procedures (including critical care time)  Medications Ordered in UC Medications - No data to display  Initial Impression / Assessment and Plan / UC Course  I have reviewed the triage vital signs and the nursing notes.  Pertinent labs & imaging results that were available during my care of the patient were reviewed by me and considered in my medical decision making (see chart for details).     Patient is overall well-appearing.  Vitals are stable.  Congestion rhinorrhea present, erythema and PND noted to posterior oropharynx.  Mild generalized abdominal tenderness noted on exam.  Heart and lung sounds normal.  COVID and flu testing negative.  Symptoms likely viral in nature.  Prescribe Zofran  as needed for nausea and vomiting.  Prescribed Tessalon and Promethazine DM as needed for cough.  Discussed over-the-counter medications as needed for symptoms.  Discussed importance of hydration.  Discussed follow-up, return, and strict ER precautions. Final Clinical Impressions(s) / UC Diagnoses   Final diagnoses:  Viral illness  Acute cough  Nausea, vomiting and diarrhea     Discharge Instructions      Your COVID and flu testing are both  negative today.  I believe your symptoms are likely viral in nature. You can take Zofran  every 8 hours as needed for nausea and vomiting. You can take Tessalon every 8 hours as needed for cough. Take Promethazine DM  cough syrup at bedtime as needed for cough.  This can make you drowsy so do not drive, work, or drink alcohol while taking this. Make sure you are staying hydrated and getting plenty of rest. Follow-up with your primary care provider or return here as needed. If you develop worsening abdominal pain, excessive vomiting, abdominal pain or stool, high fevers, severe weakness, or passing out please seek immediate medical treatment in the emergency department.   ED Prescriptions     Medication Sig Dispense Auth. Provider   ondansetron  (ZOFRAN -ODT) 4 MG disintegrating tablet Take 1 tablet (4 mg total) by mouth every 8 (eight) hours as needed for nausea or vomiting. 10 tablet Johnie Flaming A, NP   promethazine-dextromethorphan (PROMETHAZINE-DM) 6.25-15 MG/5ML syrup Take 5 mLs by mouth at bedtime as needed for cough. 118 mL Johnie, Tiahna Cure A, NP   benzonatate (TESSALON) 100 MG capsule Take 1 capsule (100 mg total) by mouth every 8 (eight) hours. 21 capsule Johnie Flaming A, NP      PDMP not reviewed this encounter.   Johnie Flaming A, NP 01/29/24 1058
